# Patient Record
Sex: Female | Born: 1955 | Race: White | Hispanic: No | Smoking: Never smoker
Health system: Southern US, Community
[De-identification: ages and names within clinical notes are randomized; demographics above are authoritative.]

## PROBLEM LIST (undated history)

## (undated) DIAGNOSIS — E079 Disorder of thyroid, unspecified: Secondary | ICD-10-CM

## (undated) DIAGNOSIS — Z9189 Other specified personal risk factors, not elsewhere classified: Secondary | ICD-10-CM

## (undated) DIAGNOSIS — E559 Vitamin D deficiency, unspecified: Secondary | ICD-10-CM

## (undated) DIAGNOSIS — Z1371 Encounter for nonprocreative screening for genetic disease carrier status: Secondary | ICD-10-CM

## (undated) DIAGNOSIS — K219 Gastro-esophageal reflux disease without esophagitis: Secondary | ICD-10-CM

## (undated) DIAGNOSIS — L509 Urticaria, unspecified: Secondary | ICD-10-CM

## (undated) DIAGNOSIS — E785 Hyperlipidemia, unspecified: Secondary | ICD-10-CM

## (undated) DIAGNOSIS — E039 Hypothyroidism, unspecified: Secondary | ICD-10-CM

## (undated) DIAGNOSIS — Z803 Family history of malignant neoplasm of breast: Secondary | ICD-10-CM

## (undated) DIAGNOSIS — F32A Depression, unspecified: Secondary | ICD-10-CM

## (undated) DIAGNOSIS — K589 Irritable bowel syndrome without diarrhea: Secondary | ICD-10-CM

## (undated) DIAGNOSIS — F411 Generalized anxiety disorder: Secondary | ICD-10-CM

## (undated) DIAGNOSIS — Z8041 Family history of malignant neoplasm of ovary: Secondary | ICD-10-CM

## (undated) DIAGNOSIS — F329 Major depressive disorder, single episode, unspecified: Secondary | ICD-10-CM

## (undated) DIAGNOSIS — F419 Anxiety disorder, unspecified: Secondary | ICD-10-CM

## (undated) HISTORY — PX: BREAST BIOPSY: SHX20

## (undated) HISTORY — DX: Anxiety disorder, unspecified: F41.9

## (undated) HISTORY — DX: Hyperlipidemia, unspecified: E78.5

## (undated) HISTORY — DX: Disorder of thyroid, unspecified: E07.9

## (undated) HISTORY — DX: Family history of malignant neoplasm of ovary: Z80.41

## (undated) HISTORY — PX: TONSILLECTOMY: SUR1361

## (undated) HISTORY — DX: Gastro-esophageal reflux disease without esophagitis: K21.9

## (undated) HISTORY — DX: Family history of malignant neoplasm of breast: Z80.3

## (undated) HISTORY — DX: Vitamin D deficiency, unspecified: E55.9

## (undated) HISTORY — DX: Major depressive disorder, single episode, unspecified: F32.9

## (undated) HISTORY — DX: Depression, unspecified: F32.A

## (undated) HISTORY — PX: BREAST SURGERY: SHX581

---
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
## 2004-03-21 ENCOUNTER — Ambulatory Visit: Payer: Self-pay

## 2005-07-09 ENCOUNTER — Ambulatory Visit: Payer: Self-pay

## 2006-08-06 ENCOUNTER — Ambulatory Visit: Payer: Self-pay

## 2007-01-10 ENCOUNTER — Ambulatory Visit: Payer: Self-pay | Admitting: Unknown Physician Specialty

## 2007-11-14 ENCOUNTER — Ambulatory Visit: Payer: Self-pay

## 2009-02-10 ENCOUNTER — Ambulatory Visit: Payer: Self-pay

## 2009-02-15 ENCOUNTER — Ambulatory Visit: Payer: Self-pay

## 2009-08-23 ENCOUNTER — Ambulatory Visit: Payer: Self-pay | Admitting: General Surgery

## 2010-04-17 ENCOUNTER — Ambulatory Visit: Payer: Self-pay

## 2011-06-13 ENCOUNTER — Ambulatory Visit: Payer: Self-pay

## 2011-06-15 ENCOUNTER — Ambulatory Visit: Payer: Self-pay

## 2012-06-16 ENCOUNTER — Ambulatory Visit: Payer: Self-pay

## 2013-01-02 ENCOUNTER — Ambulatory Visit: Payer: Self-pay | Admitting: Emergency Medicine

## 2013-06-24 ENCOUNTER — Ambulatory Visit: Payer: Self-pay

## 2014-05-07 HISTORY — PX: ABDOMINAL HYSTERECTOMY: SHX81

## 2014-06-09 ENCOUNTER — Ambulatory Visit: Payer: Self-pay | Admitting: Obstetrics and Gynecology

## 2014-06-09 LAB — BASIC METABOLIC PANEL
Anion Gap: 6 — ABNORMAL LOW (ref 7–16)
BUN: 11 mg/dL (ref 7–18)
Calcium, Total: 8.8 mg/dL (ref 8.5–10.1)
Chloride: 107 mmol/L (ref 98–107)
Co2: 28 mmol/L (ref 21–32)
Creatinine: 0.96 mg/dL (ref 0.60–1.30)
EGFR (Non-African Amer.): >60
Glucose: 68 mg/dL (ref 65–99)
Osmolality: 279 (ref 275–301)
Potassium: 4.5 mmol/L (ref 3.5–5.1)
Sodium: 141 mmol/L (ref 136–145)

## 2014-06-09 LAB — CBC
HCT: 36.9 % (ref 35.0–47.0)
HGB: 12.2 g/dL (ref 12.0–16.0)
MCH: 27.3 pg (ref 26.0–34.0)
MCHC: 33.1 g/dL (ref 32.0–36.0)
MCV: 82 fL (ref 80–100)
PLATELETS: 185 10*3/uL (ref 150–440)
RBC: 4.48 10*6/uL (ref 3.80–5.20)
RDW: 14.5 % (ref 11.5–14.5)
WBC: 4.2 10*3/uL (ref 3.6–11.0)

## 2014-06-17 ENCOUNTER — Ambulatory Visit: Payer: Self-pay | Admitting: Obstetrics and Gynecology

## 2014-07-14 ENCOUNTER — Ambulatory Visit: Payer: Self-pay

## 2014-08-30 LAB — SURGICAL PATHOLOGY

## 2014-09-05 NOTE — Op Note (Signed)
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   PROCEDURES: Total laparoscopic hysterectomy, bilateral salpingo-oophorectomy, and cystoscopy.   SURGEON: [place] Bingharlie Kiwan Gadsden, MD  ASSISTANT: Felizardo HoffmannStephan D. Jackson, MD   ANESTHESIA: General and local.   INTRAVENOUS FLUIDS: 1600 mL crystalloid.   ESTIMATED BLOOD LOSS: 50 mL.   URINE OUTPUT: 300 mL.   COMPLICATIONS: None.   ANTIBIOTICS: Ancef 2 grams given preoperatively.   SPECIMENS: Cervix, uterus, and bilateral tubes and ovaries to pathology.   DISPOSITION: Stable to PACU.   FINDINGS: Normal pelvic anatomy. No intra-abdominal adhesions were noted. Normal-appearing cervix, uterus, tubes, and ovaries. Normal liver and stomach edge. On cystoscopy, normal bladder with positive jets from her ureteral orifices.  DESCRIPTION OF PROCEDURE: The patient was taken to the operating room, where the anesthesia was administered. She was then prepped and draped in normal sterile fashion in dorsal lithotomy position in [place] stirrups. Next, a Foley catheter was then placed and a speculum was inserted and a medium cup VCare uterine manipulator was then placed without difficulty. Next, gloves were then changed and attention was then turned to the patient's abdomen. The umbilical fold was injected with local anesthesia and a 10 mm skin incision was then made and the abdomen entered via the open technique. Next, the balloon trocar was then placed and laparoscope introduced with the above-noted findings. The abdomen was then insufflated and a 5 mm right lower quadrant port and a 10 mm left lower quadrant port were then placed under direct visualization. Next, the bilateral round ligaments were then cauterized and  transected, and a bladder flap was then created with the Ligasure and blunt dissection. Then, the posterior leaf of the broad ligament was then opened and the IP skeletonized. Next, the bilateral ureters were then traced from the pelvic brim down to the lower pelvis, and the IP ligaments were then serially cauterized and then transected with the LigaSure device. Next, the bilateral uterine arteries were then skeletonized and cauterized, and transected with the LigaSure device. Using the monopolar scissors, the colpotomy was then made, first on coag then on cut circumferentially around the top of the uterine manipulator, and the manipulator and specimens were then removed from the patient's vagina. A balloon was then placed inside the patient's vagina, and the vagina was then closed using the 2-0 V-Loc on the Endo Stitch device. At this time, the patient's abdomen  pressure was then lowered to 6, and all pedicles were observed and noted to be hemostatic. Next, attention was then turned to the patient's bladder and the Foley catheter was removed, and a cystoscope introduced with the above-noted findings. Next, the remaining ports were then removed under direct visualization with the umbilical and left lower quadrant fascia closed using 0 Vicryl and the skin incisions closed with 4-0 Monocryl at the umbilicus and Dermabond, and Dermabond at the right and left lower quadrant port sites. Sponge, lap, needle, and instrument counts were correct x2, and the patient was taken to recovery room, awake, alert, breathing independently in stable condition.    ____________________________ [place] Bingharlie Alivia Cimino, MD cp:mw D: 06/17/2014 14:53:39 ET T: 06/17/2014 19:35:05 ET JOB#: [id number]  cc: [place] Bingharlie Justo Hengel, MD, <Dictator> [place] BingHARLIE Serrena Linderman MD ELECTRONICALLY SIGNED 06/27/2014 12:41

## 2015-06-27 ENCOUNTER — Other Ambulatory Visit: Payer: Self-pay | Admitting: Unknown Physician Specialty

## 2015-06-27 DIAGNOSIS — Z1231 Encounter for screening mammogram for malignant neoplasm of breast: Secondary | ICD-10-CM

## 2015-07-15 ENCOUNTER — Ambulatory Visit
Admission: RE | Admit: 2015-07-15 | Discharge: 2015-07-15 | Disposition: A | Discharge status: Home / Self Care | Payer: BC Managed Care – PPO | Source: Ambulatory Visit | Attending: Unknown Physician Specialty | Admitting: Unknown Physician Specialty

## 2015-07-15 DIAGNOSIS — Z1231 Encounter for screening mammogram for malignant neoplasm of breast: Secondary | ICD-10-CM | POA: Insufficient documentation

## 2016-04-11 IMAGING — CR DG CHEST 2V
1 series · 2 of 2 positions shown · non-contrast
Comparison: None.

CLINICAL DATA: Cough for couple of weeks, hypothyroidism

EXAM:
CHEST  2 VIEW

[Series 1: dxr chest pa (or ap) and lateral · 0.14mm/px · 2 of 2 slices shown]
[im 1/2]
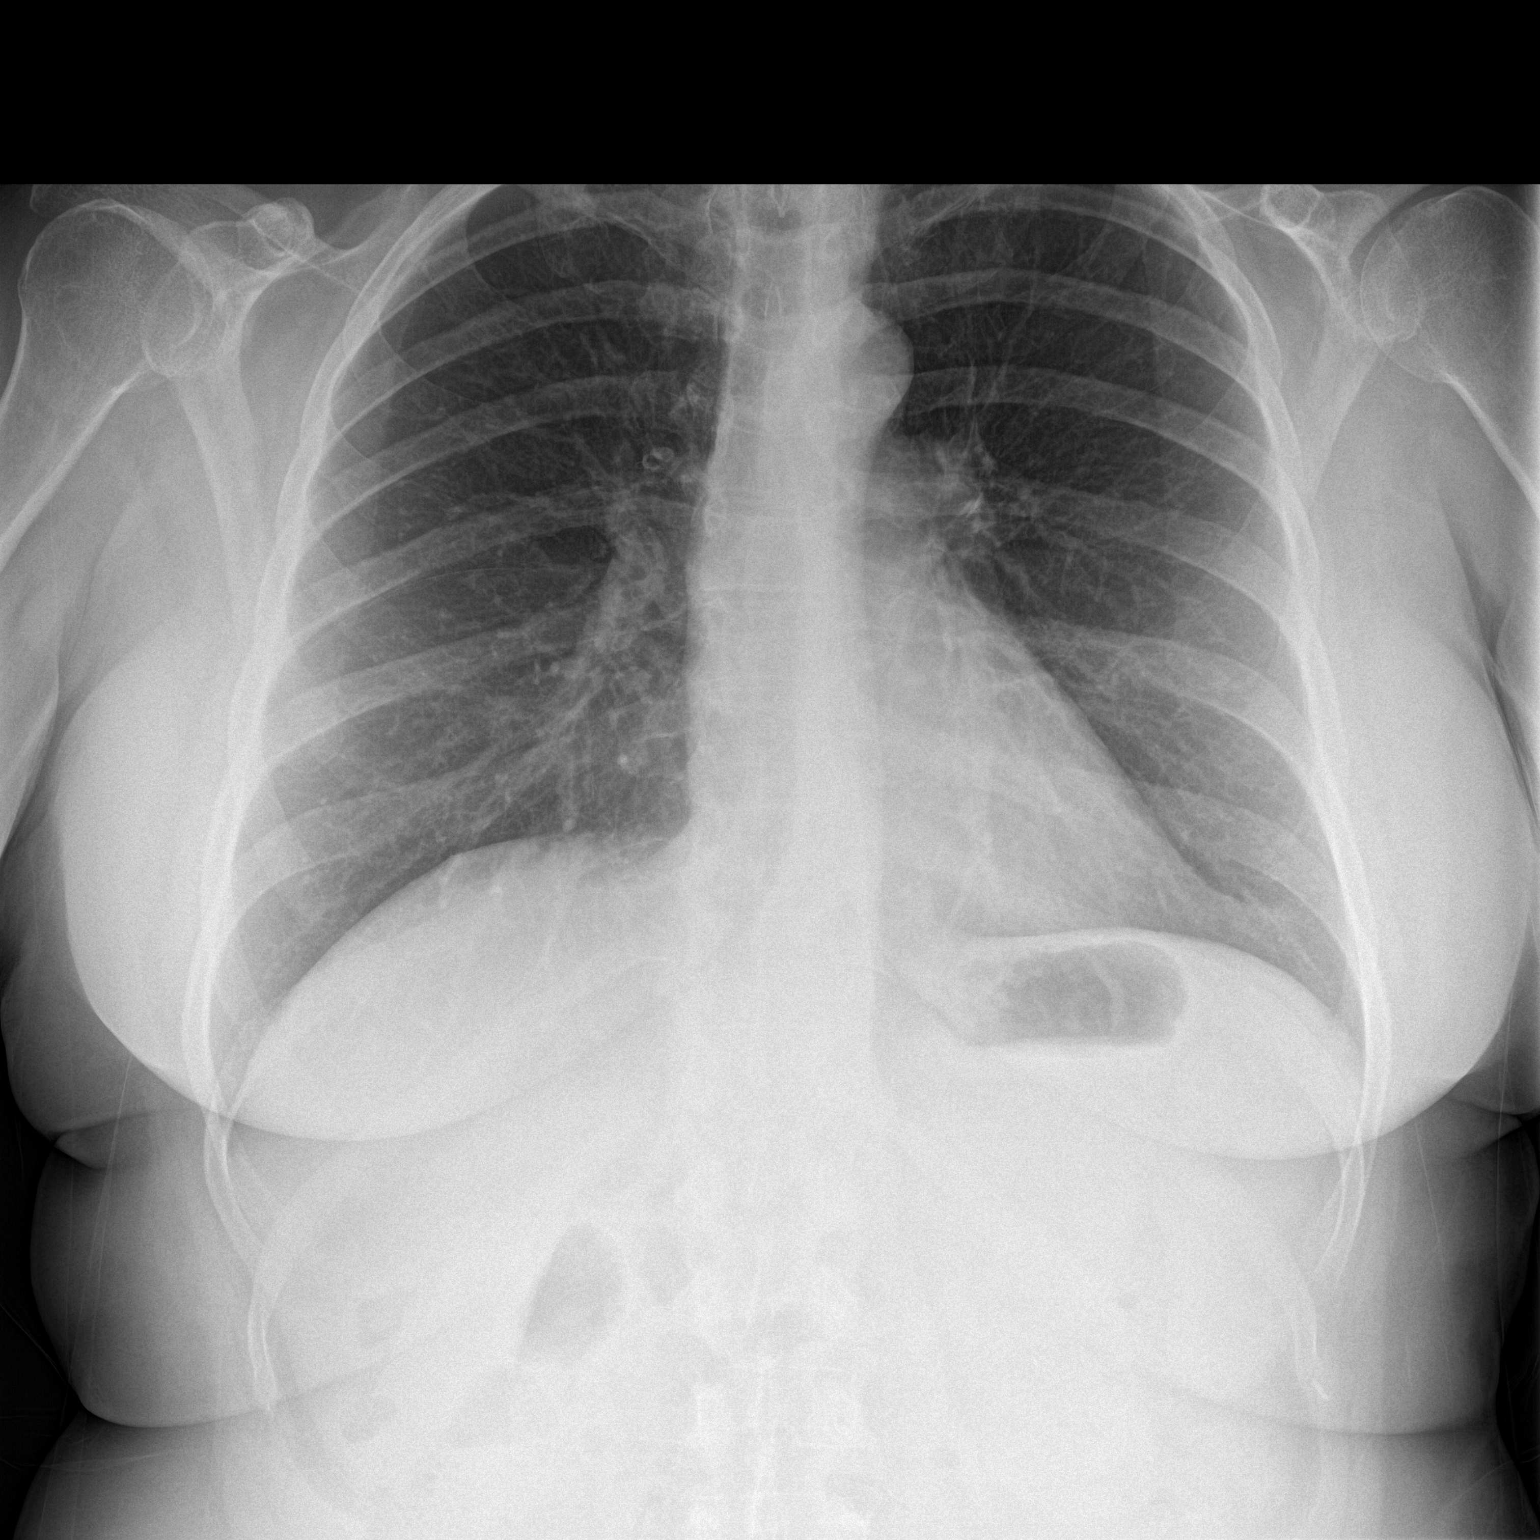
[im 2/2]
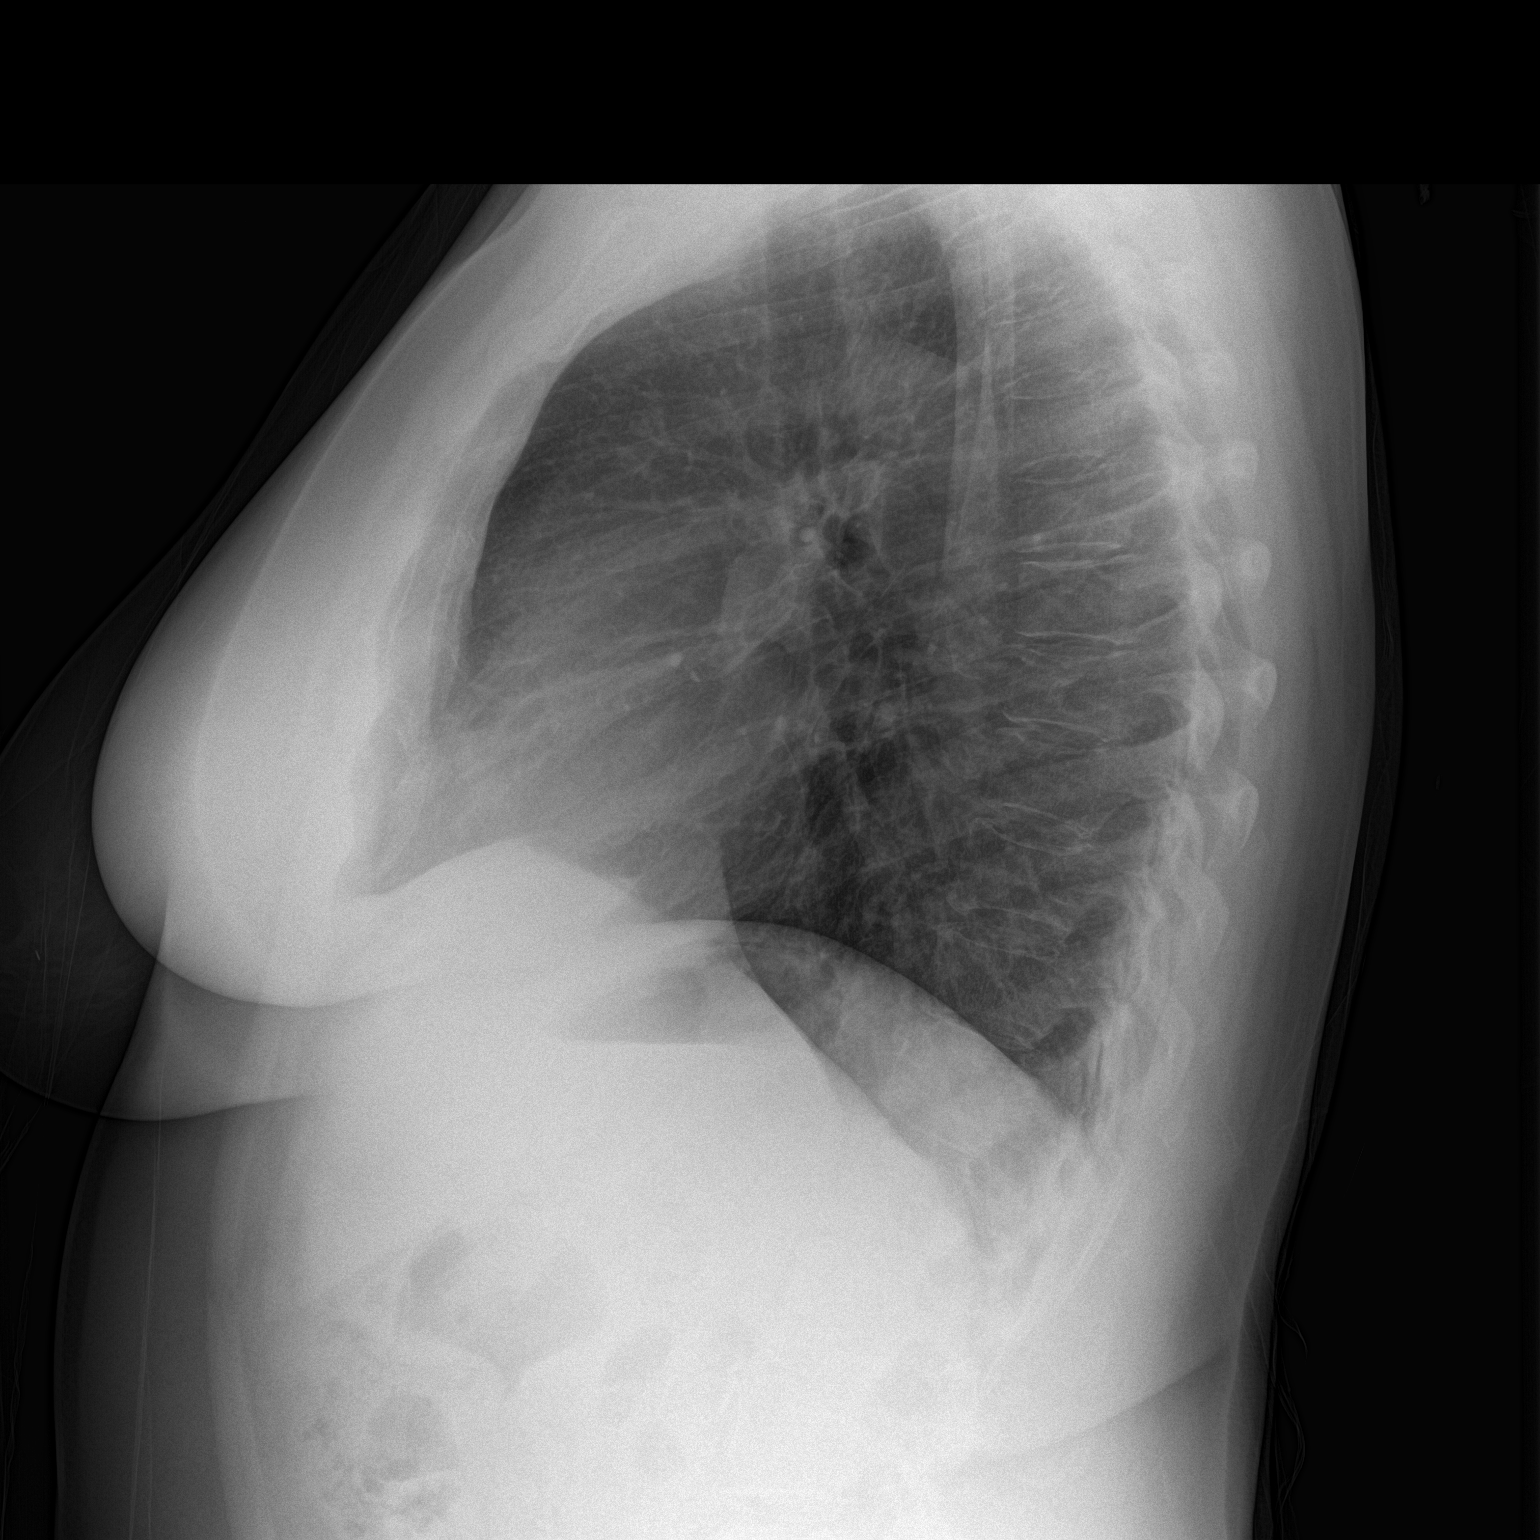

[2 of 2 positions shown; findings below may reference images not displayed]

FINDINGS: Cardiomediastinal silhouette is unremarkable. No acute infiltrate or
pleural effusion. No pulmonary edema. Bony thorax is unremarkable.
IMPRESSION: No active cardiopulmonary disease.

## 2016-05-18 ENCOUNTER — Other Ambulatory Visit: Payer: Self-pay | Admitting: Obstetrics and Gynecology

## 2016-05-18 DIAGNOSIS — Z1231 Encounter for screening mammogram for malignant neoplasm of breast: Secondary | ICD-10-CM

## 2016-08-08 ENCOUNTER — Ambulatory Visit
Admission: RE | Admit: 2016-08-08 | Discharge: 2016-08-08 | Disposition: A | Discharge status: Home / Self Care | Payer: BC Managed Care – PPO | Source: Ambulatory Visit | Attending: Obstetrics and Gynecology | Admitting: Obstetrics and Gynecology

## 2016-08-08 DIAGNOSIS — Z1231 Encounter for screening mammogram for malignant neoplasm of breast: Secondary | ICD-10-CM | POA: Insufficient documentation

## 2016-08-12 ENCOUNTER — Encounter: Payer: Self-pay | Admitting: Obstetrics and Gynecology

## 2016-08-20 ENCOUNTER — Ambulatory Visit (INDEPENDENT_AMBULATORY_CARE_PROVIDER_SITE_OTHER): Payer: BC Managed Care – PPO | Admitting: Obstetrics and Gynecology

## 2016-08-20 ENCOUNTER — Encounter: Payer: Self-pay | Admitting: Obstetrics and Gynecology

## 2016-08-20 VITALS — BP 120/70 | HR 67 | Ht 67.0 in | Wt 186.0 lb

## 2016-08-20 DIAGNOSIS — Z124 Encounter for screening for malignant neoplasm of cervix: Secondary | ICD-10-CM

## 2016-08-20 DIAGNOSIS — Z803 Family history of malignant neoplasm of breast: Secondary | ICD-10-CM

## 2016-08-20 DIAGNOSIS — Z1151 Encounter for screening for human papillomavirus (HPV): Secondary | ICD-10-CM

## 2016-08-20 DIAGNOSIS — Z01419 Encounter for gynecological examination (general) (routine) without abnormal findings: Secondary | ICD-10-CM | POA: Diagnosis not present

## 2016-08-20 DIAGNOSIS — D069 Carcinoma in situ of cervix, unspecified: Secondary | ICD-10-CM | POA: Insufficient documentation

## 2016-08-20 DIAGNOSIS — Z8041 Family history of malignant neoplasm of ovary: Secondary | ICD-10-CM | POA: Diagnosis not present

## 2016-08-20 NOTE — Progress Notes (Signed)
Chief Complaint  Patient presents with  . Gynecologic Exam    HPI:      [name] is a 61 y.o. No obstetric history on file. who LMP was No LMP recorded. Patient has had a hysterectomy., presents today for her annual examination.  Her menses are absent due to menopause/hyst.  Dysmenorrhea none. She does not have intermenstrual bleeding.  She does not have vasomotor sx.  Sex activity: not sexually active. She does not have vaginal dryness.  Last Pap: August 05, 2015  Results were: no abnormalities /neg HPV DNA.    Last mammogram: August 08, 2016  Results were: normal--routine follow-up in 12 months There is a FH of breast cancer in her sister, who is BRCA neg. There is a FH of ovarian cancer in her mom. Pt is s/p BSO. The patient does do self-breast exams.  Colonoscopy: scheduled for this yr with Dr. Vira Agar  Tobacco use: The patient denies current or previous tobacco use. Alcohol use: none Exercise: moderately active  She does get adequate calcium and Vitamin D in her diet.   Past Medical History:  Diagnosis Date  . Depression   . Family history of breast cancer    sister is BRCA neg  . Family history of ovarian cancer    mother, pt is s/p BSO  . GERD (gastroesophageal reflux disease)   . Hyperlipidemia   . Thyroid disease   . Vitamin D deficiency     Past Surgical History:  Procedure Laterality Date  . ABDOMINAL HYSTERECTOMY  2016   TAH BSO Dr. Ilda Basset  . BREAST SURGERY Right    breast biospy needle, negative Dr. Bary Castilla  . TONSILLECTOMY      Family History  Problem Relation Age of Onset  . Breast cancer Sister 80  . Uterine cancer Sister 82  . Ovarian cancer Mother 57    Social History   Social History  . Marital status: Divorced    Spouse name: N/A  . Number of children: N/A  . Years of education: N/A   Occupational History  . Not on file.   Social History Main Topics  . Smoking status: Never Smoker  . Smokeless tobacco: Never Used  .  Alcohol use No  . Drug use: No  . Sexual activity: Not Currently   Other Topics Concern  . Not on file   Social History Narrative  . No narrative on file     Current Outpatient Prescriptions:  .  FLUoxetine (PROZAC) 20 MG capsule, Take by mouth., Disp: , Rfl:  .  pravastatin (PRAVACHOL) 20 MG tablet, Take by mouth., Disp: , Rfl:  .  levothyroxine (SYNTHROID, LEVOTHROID) 88 MCG tablet, Take by mouth., Disp: , Rfl:  .  Omeprazole 20 MG TBEC, Take by mouth., Disp: , Rfl:  .  Vitamin D, Ergocalciferol, (DRISDOL) 50000 units CAPS capsule, , Disp: , Rfl:    ROS:  Review of Systems  Constitutional: Negative for fever, malaise/fatigue and weight loss.  HENT: Negative for congestion, ear pain and sinus pain.   Respiratory: Negative for cough, shortness of breath and wheezing.   Cardiovascular: Negative for chest pain, orthopnea and leg swelling.  Gastrointestinal: Negative for constipation, diarrhea, nausea and vomiting.  Genitourinary: Negative for dysuria, frequency, hematuria and urgency.       Breast ROS: negative   Musculoskeletal: Negative for back pain, joint pain and myalgias.  Skin: Negative for itching and rash.  Neurological: Negative for dizziness, tingling, focal weakness and headaches.  Endo/Heme/Allergies: Negative for environmental allergies. Does not bruise/bleed easily.  Psychiatric/Behavioral: Negative for depression and suicidal ideas. The patient is not nervous/anxious and does not have insomnia.     Objective: BP 120/70   Pulse 67   Ht _0  (1.702 m)   Wt 186 lb (84.4 kg)   BMI 29.13 kg/m    Physical Exam  Constitutional: She is oriented to person, place, and time. She appears well-developed and well-nourished.  Genitourinary: Vagina normal. No erythema or tenderness in the vagina. No vaginal discharge found. Right adnexum does not display mass and does not display tenderness. Left adnexum does not display mass and does not display tenderness.    Genitourinary Comments: CX AND UTERUS ABSENT  Neck: Normal range of motion. No thyromegaly present.  Cardiovascular: Normal rate, regular rhythm and normal heart sounds.   No murmur heard. Pulmonary/Chest: Effort normal and breath sounds normal. Right breast exhibits no mass, no nipple discharge, no skin change and no tenderness. Left breast exhibits no mass, no nipple discharge, no skin change and no tenderness.  Abdominal: Soft. There is no tenderness. There is no guarding.  Musculoskeletal: Normal range of motion.  Neurological: She is alert and oriented to person, place, and time. No cranial nerve deficit.  Psychiatric: She has a normal mood and affect. Her behavior is normal.  Vitals reviewed.   Assessment/Plan: Encounter for annual routine gynecological examination  Cervical cancer screening - Hx of abn paps prior to 2016 hyst. - Plan: IGP, Aptima HPV  Screening for HPV (human papillomavirus) - Plan: IGP, Aptima HPV  Family history of breast cancer - My Risk testing discussed, handout given. Pt to discuss update testing with sister and will f/u if desires personal testing. Cont with mammos yearly/monthly SBE  Family history of ovarian cancer  Severe cervical dysplasia - CIN 3 2016, s/p LEEP and TAH           GYN counsel adequate intake of calcium and vitamin D     F/U  Return in about 1 year (around 08/20/2017).  Alicia B. Copland, PA-C 08/20/2016 2:30 PM

## 2016-08-22 LAB — IGP, APTIMA HPV
HPV Aptima: NEGATIVE
PAP SMEAR COMMENT: 0

## 2016-09-04 HISTORY — PX: COLONOSCOPY: SHX174

## 2016-10-03 ENCOUNTER — Encounter: Payer: Self-pay | Admitting: Obstetrics and Gynecology

## 2016-12-24 ENCOUNTER — Telehealth: Payer: Self-pay

## 2016-12-24 NOTE — Telephone Encounter (Signed)
Pt states she needs a TDAP inj.  How to go about lining that up.  [phone]

## 2016-12-24 NOTE — Telephone Encounter (Signed)
Pls have pt sched with nurse for TdAP. Thx.

## 2016-12-25 NOTE — Telephone Encounter (Signed)
Per pt refused to schedule to to cost on copay for schedule appt

## 2016-12-26 ENCOUNTER — Ambulatory Visit: Payer: BC Managed Care – PPO

## 2017-08-14 ENCOUNTER — Other Ambulatory Visit: Payer: Self-pay | Admitting: Obstetrics and Gynecology

## 2017-08-14 DIAGNOSIS — Z1231 Encounter for screening mammogram for malignant neoplasm of breast: Secondary | ICD-10-CM

## 2017-09-02 ENCOUNTER — Encounter: Payer: Self-pay | Admitting: Obstetrics and Gynecology

## 2017-09-02 ENCOUNTER — Ambulatory Visit
Admission: RE | Admit: 2017-09-02 | Discharge: 2017-09-02 | Disposition: A | Discharge status: Home / Self Care | Payer: BC Managed Care – PPO | Source: Ambulatory Visit | Attending: Obstetrics and Gynecology | Admitting: Obstetrics and Gynecology

## 2017-09-02 DIAGNOSIS — Z1231 Encounter for screening mammogram for malignant neoplasm of breast: Secondary | ICD-10-CM | POA: Insufficient documentation

## 2017-09-11 ENCOUNTER — Ambulatory Visit: Payer: BC Managed Care – PPO | Admitting: Obstetrics and Gynecology

## 2017-09-24 ENCOUNTER — Encounter: Payer: Self-pay | Admitting: Obstetrics and Gynecology

## 2017-09-24 ENCOUNTER — Ambulatory Visit (INDEPENDENT_AMBULATORY_CARE_PROVIDER_SITE_OTHER): Payer: BC Managed Care – PPO | Admitting: Obstetrics and Gynecology

## 2017-09-24 VITALS — BP 130/80 | HR 70 | Ht 67.0 in | Wt 187.0 lb

## 2017-09-24 DIAGNOSIS — Z1231 Encounter for screening mammogram for malignant neoplasm of breast: Secondary | ICD-10-CM

## 2017-09-24 DIAGNOSIS — Z124 Encounter for screening for malignant neoplasm of cervix: Secondary | ICD-10-CM

## 2017-09-24 DIAGNOSIS — Z1239 Encounter for other screening for malignant neoplasm of breast: Secondary | ICD-10-CM

## 2017-09-24 DIAGNOSIS — Z1151 Encounter for screening for human papillomavirus (HPV): Secondary | ICD-10-CM | POA: Diagnosis not present

## 2017-09-24 DIAGNOSIS — Z803 Family history of malignant neoplasm of breast: Secondary | ICD-10-CM | POA: Diagnosis not present

## 2017-09-24 DIAGNOSIS — Z01419 Encounter for gynecological examination (general) (routine) without abnormal findings: Secondary | ICD-10-CM

## 2017-09-24 DIAGNOSIS — D069 Carcinoma in situ of cervix, unspecified: Secondary | ICD-10-CM | POA: Diagnosis not present

## 2017-09-24 NOTE — Patient Instructions (Signed)
I value your feedback and entrusting us with your care. If you get a [facility] patient survey, I would appreciate you taking the time to let us know about your experience today. Thank you! 

## 2017-09-24 NOTE — Progress Notes (Addendum)
Chief Complaint  Patient presents with  . Gynecologic Exam     HPI:      [name] is a 62 y.o. G1P1001 who LMP was No LMP recorded. Patient has had a hysterectomy., presents today for her annual examination. Her menses are absent due to menopause/hyst.  Dysmenorrhea none. She does not have intermenstrual bleeding.  She does not have vasomotor sx.  Sex activity: is sexually active. She does not have vaginal dryness.  Last Pap: 08/20/16  Results were: no abnormalities /neg HPV DNA. S/p CIN 3 2016 with LEEP/TAH  Last mammogram: 09/02/17  Results were: normal--routine follow-up in 12 months There is a FH of breast cancer in her sister, who is BRCA neg. There is a FH of ovarian cancer in her mom. Pt is s/p BSO. The patient does do self-breast exams. Pt declined genetic testing last yr.  Colonoscopy: 2018, repeat due after 5 yrs Tobacco use: The patient denies current or previous tobacco use. Alcohol use: none Exercise: moderately active  She does get adequate calcium and Vitamin D in her diet. Labs with PCP.    Past Medical History:  Diagnosis Date  . Anxiety   . Depression   . Family history of breast cancer    sister is BRCA neg  . Family history of ovarian cancer    mother, pt is s/p BSO  . GERD (gastroesophageal reflux disease)   . Hyperlipidemia   . Thyroid disease   . Vitamin D deficiency     Past Surgical History:  Procedure Laterality Date  . ABDOMINAL HYSTERECTOMY  2016   TAH BSO Dr. Ilda Basset  . BREAST SURGERY Right    breast biospy needle, negative Dr. Bary Castilla  . COLONOSCOPY  09/2016   2 polyps; repeat in 5 yrs; Dr. Rayann Heman  . TONSILLECTOMY      Family History  Problem Relation Age of Onset  . Breast cancer Sister 79  . Uterine cancer Sister 52  . Ovarian cancer Mother 56    Social History   Socioeconomic History  . Marital status: Divorced    Spouse name: Not on file  . Number of children: Not on file  . Years of education: Not on  file  . Highest education level: Not on file  Occupational History  . Not on file  Social Needs  . Financial resource strain: Not on file  . Food insecurity:    Worry: Not on file    Inability: Not on file  . Transportation needs:    Medical: Not on file    Non-medical: Not on file  Tobacco Use  . Smoking status: Never Smoker  . Smokeless tobacco: Never Used  Substance and Sexual Activity  . Alcohol use: No  . Drug use: No  . Sexual activity: Not Currently  Lifestyle  . Physical activity:    Days per week: Not on file    Minutes per session: Not on file  . Stress: Not on file  Relationships  . Social connections:    Talks on phone: Not on file    Gets together: Not on file    Attends religious service: Not on file    Active member of club or organization: Not on file    Attends meetings of clubs or organizations: Not on file    Relationship status: Not on file  . Intimate partner violence:    Fear of current or ex partner: Not on file    Emotionally abused: Not  on file    Physically abused: Not on file    Forced sexual activity: Not on file  Other Topics Concern  . Not on file  Social History Narrative  . Not on file    Current Outpatient Medications on File Prior to Visit  Medication Sig Dispense Refill  . levothyroxine (SYNTHROID, LEVOTHROID) 88 MCG tablet Take by mouth.    . Omeprazole 20 MG TBEC Take by mouth.    . pravastatin (PRAVACHOL) 20 MG tablet Take by mouth.    . Vitamin D, Ergocalciferol, (DRISDOL) 50000 units CAPS capsule     . escitalopram (LEXAPRO) 20 MG tablet   1  . FLUoxetine (PROZAC) 20 MG capsule Take by mouth.    . liothyronine (CYTOMEL) 5 MCG tablet   1   No current facility-administered medications on file prior to visit.     ROS:  Review of Systems  Constitutional: Negative for fatigue, fever and unexpected weight change.  Respiratory: Negative for cough, shortness of breath and wheezing.   Cardiovascular: Negative for chest pain,  palpitations and leg swelling.  Gastrointestinal: Negative for blood in stool, constipation, diarrhea, nausea and vomiting.  Endocrine: Negative for cold intolerance, heat intolerance and polyuria.  Genitourinary: Negative for dyspareunia, dysuria, flank pain, frequency, genital sores, hematuria, menstrual problem, pelvic pain, urgency, vaginal bleeding, vaginal discharge and vaginal pain.  Musculoskeletal: Negative for back pain, joint swelling and myalgias.  Skin: Negative for rash.  Neurological: Negative for dizziness, syncope, light-headedness, numbness and headaches.  Hematological: Negative for adenopathy.  Psychiatric/Behavioral: Negative for agitation, confusion, sleep disturbance and suicidal ideas. The patient is not nervous/anxious.     Objective: BP 130/80   Pulse 70   Ht '5\' 7"'$  (1.702 m)   Wt 187 lb (84.8 kg)   BMI 29.29 kg/m    Physical Exam  Constitutional: She is oriented to person, place, and time. She appears well-developed and well-nourished.  Genitourinary: Vagina normal. There is no rash or tenderness on the right labia. There is no rash or tenderness on the left labia. No erythema or tenderness in the vagina. No vaginal discharge found. Right adnexum does not display mass and does not display tenderness. Left adnexum does not display mass and does not display tenderness.  Genitourinary Comments: UTERUS/CX SURG REM  Neck: Normal range of motion. No thyromegaly present.  Cardiovascular: Normal rate, regular rhythm and normal heart sounds.  No murmur heard. Pulmonary/Chest: Effort normal and breath sounds normal. Right breast exhibits no mass, no nipple discharge, no skin change and no tenderness. Left breast exhibits no mass, no nipple discharge, no skin change and no tenderness.  Abdominal: Soft. There is no tenderness. There is no guarding.  Musculoskeletal: Normal range of motion.  Neurological: She is alert and oriented to person, place, and time. No cranial  nerve deficit.  Psychiatric: She has a normal mood and affect. Her behavior is normal.  Vitals reviewed.   Assessment/Plan: Encounter for annual routine gynecological examination  Screening for breast cancer - Pt current on mammo.  Family history of breast cancer - MyRIsk testing discussed but pt declines. Also discussed update testing for pt's sister.   Cervical cancer screening - Plan: IGP, Aptima HPV  Screening for HPV (human papillomavirus) - Plan: IGP, Aptima HPV  CIN III with severe dysplasia - 2016. Repeat pap today. Do yearly for now.       GYN counsel breast self exam, mammography screening, menopause, adequate intake of calcium and vitamin D, diet and exercise  F/U  Return in about 1 year (around 09/25/2018).  Tashiya Souders B. Soua Caltagirone, PA-C 09/24/2017 2:24 PM

## 2017-10-02 LAB — IGP, APTIMA HPV
HPV Aptima: NEGATIVE
PAP Smear Comment: 0

## 2018-05-19 ENCOUNTER — Other Ambulatory Visit: Payer: Self-pay | Admitting: Obstetrics and Gynecology

## 2018-05-19 DIAGNOSIS — Z1231 Encounter for screening mammogram for malignant neoplasm of breast: Secondary | ICD-10-CM

## 2018-10-05 NOTE — Progress Notes (Signed)
Chief Complaint  Patient presents with  . Gynecologic Exam     HPI:      [name] is a 63 y.o. G1P1001 who LMP was No LMP recorded. Patient has had a hysterectomy., presents today for her annual examination. Her menses are absent due to menopause/hyst.  Dysmenorrhea none. She does not have intermenstrual bleeding.  She does not have vasomotor sx.  Sex activity: is sexually active. She does have mild vaginal dryness improved with lubricants.  Last Pap: 09/24/17  Results were: no abnormalities /neg HPV DNA. S/p CIN 3 2016 with LEEP/TAH  Last mammogram: 09/02/17  Results were: normal--routine follow-up in 12 months. Has appt 7/20. There is a FH of breast cancer in her sister, who is BRCA neg. There is a FH of ovarian cancer in her mom. Pt is s/p BSO. The patient does do self-breast exams. Pt declined genetic testing past few yrs.  Colonoscopy: 2018, repeat due after 5 yrs Tobacco use: The patient denies current or previous tobacco use. Alcohol use: none Exercise: moderately active  She does get adequate calcium and Vitamin D in her diet. Labs with PCP.    Past Medical History:  Diagnosis Date  . Anxiety   . Depression   . Family history of breast cancer    sister is BRCA neg  . Family history of ovarian cancer    mother, pt is s/p BSO  . GERD (gastroesophageal reflux disease)   . Hyperlipidemia   . Thyroid disease   . Vitamin D deficiency     Past Surgical History:  Procedure Laterality Date  . ABDOMINAL HYSTERECTOMY  2016   TAH BSO Dr. Ilda Basset  . BREAST SURGERY Right    breast biospy needle, negative Dr. Bary Castilla  . COLONOSCOPY  09/2016   2 polyps; repeat in 5 yrs; Dr. Rayann Heman  . TONSILLECTOMY      Family History  Problem Relation Age of Onset  . Breast cancer Sister 25  . Uterine cancer Sister 66  . Ovarian cancer Mother 16    Social History   Socioeconomic History  . Marital status: Divorced    Spouse name: Not on file  . Number of  children: Not on file  . Years of education: Not on file  . Highest education level: Not on file  Occupational History  . Not on file  Social Needs  . Financial resource strain: Not on file  . Food insecurity:    Worry: Not on file    Inability: Not on file  . Transportation needs:    Medical: Not on file    Non-medical: Not on file  Tobacco Use  . Smoking status: Never Smoker  . Smokeless tobacco: Never Used  Substance and Sexual Activity  . Alcohol use: No  . Drug use: No  . Sexual activity: Yes    Birth control/protection: Surgical    Comment: Hysterectomy  Lifestyle  . Physical activity:    Days per week: Not on file    Minutes per session: Not on file  . Stress: Not on file  Relationships  . Social connections:    Talks on phone: Not on file    Gets together: Not on file    Attends religious service: Not on file    Active member of club or organization: Not on file    Attends meetings of clubs or organizations: Not on file    Relationship status: Not on file  . Intimate partner violence:  Fear of current or ex partner: Not on file    Emotionally abused: Not on file    Physically abused: Not on file    Forced sexual activity: Not on file  Other Topics Concern  . Not on file  Social History Narrative  . Not on file    Current Outpatient Medications on File Prior to Visit  Medication Sig Dispense Refill  . augmented betamethasone dipropionate (DIPROLENE-AF) 0.05 % cream APPLY CREAM TOPICALLY TWICE DAILY AS NEEDED TO SPOT TREAT FOR ITCHING    . cetirizine (ZYRTEC) 10 MG tablet Take by mouth.    . escitalopram (LEXAPRO) 20 MG tablet   1  . levothyroxine (SYNTHROID, LEVOTHROID) 88 MCG tablet Take by mouth.    . liothyronine (CYTOMEL) 5 MCG tablet   1  . omeprazole (PRILOSEC) 20 MG capsule     . pravastatin (PRAVACHOL) 40 MG tablet Take 40 mg by mouth at bedtime.    . valACYclovir (VALTREX) 500 MG tablet TAKE 1 CAPLET BY MOUTH ONCE DAILY    . Vitamin D,  Ergocalciferol, (DRISDOL) 50000 units CAPS capsule      No current facility-administered medications on file prior to visit.     ROS:  Review of Systems  Constitutional: Negative for fatigue, fever and unexpected weight change.  Respiratory: Negative for cough, shortness of breath and wheezing.   Cardiovascular: Negative for chest pain, palpitations and leg swelling.  Gastrointestinal: Negative for blood in stool, constipation, diarrhea, nausea and vomiting.  Endocrine: Negative for cold intolerance, heat intolerance and polyuria.  Genitourinary: Negative for dyspareunia, dysuria, flank pain, frequency, genital sores, hematuria, menstrual problem, pelvic pain, urgency, vaginal bleeding, vaginal discharge and vaginal pain.  Musculoskeletal: Negative for back pain, joint swelling and myalgias.  Skin: Negative for rash.  Neurological: Negative for dizziness, syncope, light-headedness, numbness and headaches.  Hematological: Negative for adenopathy.  Psychiatric/Behavioral: Positive for agitation. Negative for confusion, sleep disturbance and suicidal ideas. The patient is not nervous/anxious.     Objective: BP 136/90   Ht 5' 7.5" (1.715 m)   Wt 193 lb 9.6 oz (87.8 kg)   BMI 29.87 kg/m    Physical Exam Constitutional:      Appearance: She is well-developed.  Genitourinary:     Vulva and vagina normal.     No vaginal discharge, erythema or tenderness.     Cervix is absent.     Uterus is absent.     No right or left adnexal mass present.     Right adnexa absent.     Right adnexa not tender.     Left adnexa absent.     Left adnexa not tender.     Genitourinary Comments: UTERUS/CX SURG REM  Neck:     Musculoskeletal: Normal range of motion.     Thyroid: No thyromegaly.  Cardiovascular:     Rate and Rhythm: Normal rate and regular rhythm.     Heart sounds: Normal heart sounds. No murmur.  Pulmonary:     Effort: Pulmonary effort is normal.     Breath sounds: Normal breath  sounds.  Chest:     Breasts:        Right: No mass, nipple discharge, skin change or tenderness.        Left: No mass, nipple discharge, skin change or tenderness.  Abdominal:     Palpations: Abdomen is soft.     Tenderness: There is no abdominal tenderness. There is no guarding.  Musculoskeletal: Normal range of motion.  Neurological:  Mental Status: She is alert and oriented to person, place, and time.     Cranial Nerves: No cranial nerve deficit.  Psychiatric:        Behavior: Behavior normal.  Vitals signs reviewed.     Assessment/Plan: Encounter for annual routine gynecological examination  Cervical cancer screening - Plan: Cytology - PAP  Screening for HPV (human papillomavirus) - Plan: Cytology - PAP  CIN III with severe dysplasia - Plan: Cytology - PAP  Family history of breast cancer - MyRisk testing discussed and done today. Will call with results.  - Plan: [facility])  Family history of ovarian cancer - Plan: [facility] ([facility])       GYN counsel breast self exam, mammography screening, menopause, adequate intake of calcium and vitamin D, diet and exercise     F/U  Return in about 1 year (around 10/06/2019).  Remiel Corti B. Asharia Lotter, PA-C 10/06/2018 10:35 AM

## 2018-10-06 ENCOUNTER — Other Ambulatory Visit (HOSPITAL_COMMUNITY)
Admission: RE | Admit: 2018-10-06 | Discharge: 2018-10-06 | Disposition: A | Discharge status: Home / Self Care | Payer: BC Managed Care – PPO | Source: Ambulatory Visit | Attending: Obstetrics and Gynecology | Admitting: Obstetrics and Gynecology

## 2018-10-06 ENCOUNTER — Ambulatory Visit (INDEPENDENT_AMBULATORY_CARE_PROVIDER_SITE_OTHER): Payer: BC Managed Care – PPO | Admitting: Obstetrics and Gynecology

## 2018-10-06 ENCOUNTER — Other Ambulatory Visit: Payer: Self-pay

## 2018-10-06 ENCOUNTER — Encounter: Payer: Self-pay | Admitting: Obstetrics and Gynecology

## 2018-10-06 VITALS — BP 136/90 | Ht 67.5 in | Wt 193.6 lb

## 2018-10-06 DIAGNOSIS — Z8041 Family history of malignant neoplasm of ovary: Secondary | ICD-10-CM

## 2018-10-06 DIAGNOSIS — Z1371 Encounter for nonprocreative screening for genetic disease carrier status: Secondary | ICD-10-CM

## 2018-10-06 DIAGNOSIS — Z1151 Encounter for screening for human papillomavirus (HPV): Secondary | ICD-10-CM | POA: Insufficient documentation

## 2018-10-06 DIAGNOSIS — Z01419 Encounter for gynecological examination (general) (routine) without abnormal findings: Secondary | ICD-10-CM

## 2018-10-06 DIAGNOSIS — D069 Carcinoma in situ of cervix, unspecified: Secondary | ICD-10-CM

## 2018-10-06 DIAGNOSIS — Z124 Encounter for screening for malignant neoplasm of cervix: Secondary | ICD-10-CM | POA: Diagnosis not present

## 2018-10-06 DIAGNOSIS — Z9189 Other specified personal risk factors, not elsewhere classified: Secondary | ICD-10-CM

## 2018-10-06 DIAGNOSIS — Z803 Family history of malignant neoplasm of breast: Secondary | ICD-10-CM | POA: Insufficient documentation

## 2018-10-06 HISTORY — DX: Encounter for nonprocreative screening for genetic disease carrier status: Z13.71

## 2018-10-06 HISTORY — DX: Other specified personal risk factors, not elsewhere classified: Z91.89

## 2018-10-06 NOTE — Patient Instructions (Signed)
I value your feedback and entrusting us with your care. If you get a [place] patient survey, I would appreciate you taking the time to let us know about your experience today. Thank you! 

## 2018-10-09 ENCOUNTER — Telehealth: Payer: Self-pay | Admitting: Obstetrics and Gynecology

## 2018-10-09 LAB — CYTOLOGY - PAP
Diagnosis: NEGATIVE
HPV 16/18/45 genotyping: NEGATIVE
HPV: DETECTED — AB

## 2018-10-09 NOTE — Telephone Encounter (Signed)
Done

## 2018-10-09 NOTE — Telephone Encounter (Signed)
Patient is calling for labs results. Please advise. 

## 2018-10-14 ENCOUNTER — Encounter: Payer: Self-pay | Admitting: Obstetrics and Gynecology

## 2018-10-21 ENCOUNTER — Telehealth: Payer: Self-pay | Admitting: Obstetrics and Gynecology

## 2018-10-21 NOTE — Telephone Encounter (Signed)
Pt aware of neg MyRisk results. IBIS=18.7%/riskscore=23.9%.  Patient understands these results only apply to her and her children, and this is not indicative of genetic testing results of her other family members. It is recommended that her other family members have genetic testing done.  Pt also understands negative genetic testing doesn't mean she will never get any of these cancers.   Hard copy mailed to pt. F/u prn.

## 2018-11-05 ENCOUNTER — Ambulatory Visit
Admission: RE | Admit: 2018-11-05 | Discharge: 2018-11-05 | Disposition: A | Discharge status: Home / Self Care | Payer: BC Managed Care – PPO | Source: Ambulatory Visit | Attending: Obstetrics and Gynecology | Admitting: Obstetrics and Gynecology

## 2018-11-05 ENCOUNTER — Other Ambulatory Visit: Payer: Self-pay

## 2018-11-05 ENCOUNTER — Encounter: Payer: Self-pay | Admitting: Obstetrics and Gynecology

## 2018-11-05 DIAGNOSIS — Z1231 Encounter for screening mammogram for malignant neoplasm of breast: Secondary | ICD-10-CM | POA: Insufficient documentation

## 2019-07-27 ENCOUNTER — Other Ambulatory Visit: Payer: Self-pay | Admitting: Obstetrics and Gynecology

## 2019-07-27 DIAGNOSIS — Z1231 Encounter for screening mammogram for malignant neoplasm of breast: Secondary | ICD-10-CM

## 2019-10-19 ENCOUNTER — Ambulatory Visit: Payer: BC Managed Care – PPO | Admitting: Obstetrics and Gynecology

## 2019-10-20 ENCOUNTER — Encounter: Payer: Self-pay | Admitting: Obstetrics and Gynecology

## 2019-10-20 NOTE — Progress Notes (Signed)
Chief Complaint  Patient presents with  . Gynecologic Exam     HPI:      [name] is a 64 y.o. G1P1001 who LMP was No LMP recorded. Patient has had a hysterectomy., presents today for her annual examination. Her menses are absent due to [facility] BSO. No PMB. She does not have vasomotor sx.   Sex activity: not sexually active, no vag complaints.  Last Pap: 10/06/18  Results were: no abnormalities /POS HPV DNA. Had neg HPV DNA 2018 and 2019. Repeat vaginal pap due today. 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  Last mammogram: 11/05/18  Results were: normal--routine follow-up in 12 months. Has appt 7/21. There is a FH of breast cancer in her sister, who is BRCA neg 2011 with [facility]. There is a FH of ovarian cancer in her mom. Pt is s/p BSO. Pt is MyRisk neg 2020; IBIS=18.7%/riskscore=23.9%. The patient does do self-breast exams. Has not had scr breast MRI. Her daughter had breast bx yesterday for suspicious breast lesion, awaiting path. There is FH breast cancer on her daughter's pat side.   Colonoscopy: 2018, repeat due after 5 yrs  Tobacco use: The patient denies current or previous tobacco use. Alcohol use: none No drug use Exercise: moderately active  She does get adequate calcium and Vitamin D in her diet. Labs with PCP.    Past Medical History:  Diagnosis Date  . Anxiety   . BRCA negative 10/2018   MyRisk neg  . Depression   . Family history of breast cancer    sister is BRCA neg  . Family history of ovarian cancer    mother, pt is s/p BSO  . GERD (gastroesophageal reflux disease)   . Hyperlipidemia   . Increased risk of breast cancer 10/2018   IBIS=18.7%/riskscore=23.9%  . Thyroid disease   . Vitamin D deficiency     Past Surgical History:  Procedure Laterality Date  . ABDOMINAL HYSTERECTOMY  2016   TAH BSO Dr. Ilda Basset  . BREAST BIOPSY    . BREAST SURGERY Right    breast biospy needle, negative Dr. Bary Castilla  . COLONOSCOPY  09/2016   2 polyps; repeat  in 5 yrs; Dr. Rayann Heman  . TONSILLECTOMY      Family History  Problem Relation Age of Onset  . Breast cancer Sister 38  . Uterine cancer Sister 52  . Ovarian cancer Mother 83    Social History   Socioeconomic History  . Marital status: Divorced    Spouse name: Not on file  . Number of children: Not on file  . Years of education: Not on file  . Highest education level: Not on file  Occupational History  . Not on file  Tobacco Use  . Smoking status: Never Smoker  . Smokeless tobacco: Never Used  Vaping Use  . Vaping Use: Never used  Substance and Sexual Activity  . Alcohol use: No  . Drug use: No  . Sexual activity: Not Currently    Birth control/protection: Surgical    Comment: Hysterectomy  Other Topics Concern  . Not on file  Social History Narrative  . Not on file   Social Determinants of Health   Financial Resource Strain:   . Difficulty of Paying Living Expenses:   Food Insecurity:   . Worried About Charity fundraiser in the Last Year:   . Arboriculturist in the Last Year:   Transportation Needs:   . Film/video editor (Medical):   Marland Kitchen  Lack of Transportation (Non-Medical):   Physical Activity:   . Days of Exercise per Week:   . Minutes of Exercise per Session:   Stress:   . Feeling of Stress :   Social Connections:   . Frequency of Communication with Friends and Family:   . Frequency of Social Gatherings with Friends and Family:   . Attends Religious Services:   . Active Member of Clubs or Organizations:   . Attends Archivist Meetings:   Marland Kitchen Marital Status:   Intimate Partner Violence:   . Fear of Current or Ex-Partner:   . Emotionally Abused:   Marland Kitchen Physically Abused:   . Sexually Abused:     Current Outpatient Medications on File Prior to Visit  Medication Sig Dispense Refill  . augmented betamethasone dipropionate (DIPROLENE-AF) 0.05 % cream APPLY CREAM TOPICALLY TWICE DAILY AS NEEDED TO SPOT TREAT FOR ITCHING    . escitalopram (LEXAPRO)  20 MG tablet   1  . levothyroxine (SYNTHROID, LEVOTHROID) 88 MCG tablet Take by mouth.    . liothyronine (CYTOMEL) 5 MCG tablet   1  . Multiple Vitamin (MULTI-VITAMIN PO) Take by mouth.    Marland Kitchen omeprazole (PRILOSEC) 20 MG capsule     . pravastatin (PRAVACHOL) 40 MG tablet Take 40 mg by mouth at bedtime.    . valACYclovir (VALTREX) 500 MG tablet TAKE 1 CAPLET BY MOUTH ONCE DAILY    . Vitamin D, Ergocalciferol, (DRISDOL) 50000 units CAPS capsule     . cetirizine (ZYRTEC) 10 MG tablet Take by mouth.     No current facility-administered medications on file prior to visit.    ROS:  Review of Systems  Constitutional: Negative for fatigue, fever and unexpected weight change.  Respiratory: Negative for cough, shortness of breath and wheezing.   Cardiovascular: Negative for chest pain, palpitations and leg swelling.  Gastrointestinal: Negative for blood in stool, constipation, diarrhea, nausea and vomiting.  Endocrine: Negative for cold intolerance, heat intolerance and polyuria.  Genitourinary: Negative for dyspareunia, dysuria, flank pain, frequency, genital sores, hematuria, menstrual problem, pelvic pain, urgency, vaginal bleeding, vaginal discharge and vaginal pain.  Musculoskeletal: Negative for back pain, joint swelling and myalgias.  Skin: Negative for rash.  Neurological: Negative for dizziness, syncope, light-headedness, numbness and headaches.  Hematological: Negative for adenopathy.  Psychiatric/Behavioral: Negative for agitation, confusion, sleep disturbance and suicidal ideas. The patient is not nervous/anxious.     Objective: BP 126/80   Ht 5' 7.5" (1.715 m)   Wt 187 lb (84.8 kg)   BMI 28.86 kg/m    Physical Exam Constitutional:      Appearance: She is well-developed.  Genitourinary:     Vulva and vagina normal.     No vaginal discharge, erythema or tenderness.     Cervix is absent.     Uterus is absent.     No right or left adnexal mass present.     Right adnexa  absent.     Right adnexa not tender.     Left adnexa absent.     Left adnexa not tender.     Genitourinary Comments: UTERUS/CX SURG REM  Neck:     Thyroid: No thyromegaly.  Cardiovascular:     Rate and Rhythm: Normal rate and regular rhythm.     Heart sounds: Normal heart sounds. No murmur heard.   Pulmonary:     Effort: Pulmonary effort is normal.     Breath sounds: Normal breath sounds.  Chest:     Breasts:  Right: No mass, nipple discharge, skin change or tenderness.        Left: No mass, nipple discharge, skin change or tenderness.  Abdominal:     Palpations: Abdomen is soft.     Tenderness: There is no abdominal tenderness. There is no guarding.  Musculoskeletal:        General: Normal range of motion.     Cervical back: Normal range of motion.  Neurological:     General: No focal deficit present.     Mental Status: She is alert and oriented to person, place, and time.     Cranial Nerves: No cranial nerve deficit.  Skin:    General: Skin is warm and dry.  Psychiatric:        Mood and Affect: Mood normal.        Behavior: Behavior normal.        Thought Content: Thought content normal.        Judgment: Judgment normal.  Vitals reviewed.     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 Will call with results.   Encounter for screening mammogram for malignant neoplasm of breast; pt has mammo sched.  Family history of breast cancer--Pt is MyRisk neg. Pt's daughter having breast bx. Suggested pt's sister do update testing, which if positive, would decrease pt's risk of breast cancer.  Increased risk of breast cancer--Discussed monthly SBE, yearly CBE and mammos, scr breast MRI. Pt to call for MRI ref if desires. Cont Vit D supp.  Family  history of ovarian cancer--pt is MyRisk neg. No further screening indicated.       GYN counsel breast self exam, mammography screening, menopause, adequate intake of calcium and vitamin D, diet and exercise     F/U  Return in about 1 year (around 10/20/2020).  Nayah Lukens B. Taressa Rauh, PA-C 10/21/2019 11:31 AM

## 2019-10-21 ENCOUNTER — Ambulatory Visit (INDEPENDENT_AMBULATORY_CARE_PROVIDER_SITE_OTHER): Payer: Medicare Other | Admitting: Obstetrics and Gynecology

## 2019-10-21 ENCOUNTER — Other Ambulatory Visit (HOSPITAL_COMMUNITY)
Admission: RE | Admit: 2019-10-21 | Discharge: 2019-10-21 | Disposition: A | Discharge status: Home / Self Care | Payer: Medicare Other | Source: Ambulatory Visit | Attending: Obstetrics and Gynecology | Admitting: Obstetrics and Gynecology

## 2019-10-21 ENCOUNTER — Other Ambulatory Visit: Payer: Self-pay

## 2019-10-21 ENCOUNTER — Encounter: Payer: Self-pay | Admitting: Obstetrics and Gynecology

## 2019-10-21 VITALS — BP 126/80 | Ht 67.5 in | Wt 187.0 lb

## 2019-10-21 DIAGNOSIS — Z803 Family history of malignant neoplasm of breast: Secondary | ICD-10-CM

## 2019-10-21 DIAGNOSIS — R87810 Cervical high risk human papillomavirus (HPV) DNA test positive: Secondary | ICD-10-CM | POA: Insufficient documentation

## 2019-10-21 DIAGNOSIS — Z9189 Other specified personal risk factors, not elsewhere classified: Secondary | ICD-10-CM

## 2019-10-21 DIAGNOSIS — Z01419 Encounter for gynecological examination (general) (routine) without abnormal findings: Secondary | ICD-10-CM | POA: Diagnosis not present

## 2019-10-21 DIAGNOSIS — Z1151 Encounter for screening for human papillomavirus (HPV): Secondary | ICD-10-CM

## 2019-10-21 DIAGNOSIS — R87811 Vaginal high risk human papillomavirus (HPV) DNA test positive: Secondary | ICD-10-CM | POA: Diagnosis not present

## 2019-10-21 DIAGNOSIS — Z124 Encounter for screening for malignant neoplasm of cervix: Secondary | ICD-10-CM | POA: Insufficient documentation

## 2019-10-21 DIAGNOSIS — Z1231 Encounter for screening mammogram for malignant neoplasm of breast: Secondary | ICD-10-CM

## 2019-10-21 DIAGNOSIS — Z8041 Family history of malignant neoplasm of ovary: Secondary | ICD-10-CM

## 2019-10-21 NOTE — Patient Instructions (Signed)
I value your feedback and entrusting us with your care. If you get a  patient survey, I would appreciate you taking the time to let us know about your experience today. Thank you!  As of April 16, 2019, your lab results will be released to your MyChart immediately, before I even have a chance to see them. Please give me time to review them and contact you if there are any abnormalities. Thank you for your patience.  

## 2019-10-22 LAB — CYTOLOGY - PAP
Comment: NEGATIVE
Diagnosis: NEGATIVE
High risk HPV: NEGATIVE

## 2019-11-13 ENCOUNTER — Ambulatory Visit
Admission: RE | Admit: 2019-11-13 | Discharge: 2019-11-13 | Disposition: A | Discharge status: Home / Self Care | Payer: Medicare Other | Source: Ambulatory Visit | Attending: Obstetrics and Gynecology | Admitting: Obstetrics and Gynecology

## 2019-11-13 DIAGNOSIS — Z1231 Encounter for screening mammogram for malignant neoplasm of breast: Secondary | ICD-10-CM | POA: Diagnosis not present

## 2019-11-14 ENCOUNTER — Encounter: Payer: Self-pay | Admitting: Obstetrics and Gynecology

## 2020-07-21 ENCOUNTER — Other Ambulatory Visit: Payer: Self-pay | Admitting: Obstetrics and Gynecology

## 2020-07-21 DIAGNOSIS — Z1231 Encounter for screening mammogram for malignant neoplasm of breast: Secondary | ICD-10-CM

## 2020-10-26 NOTE — Progress Notes (Signed)
Chief Complaint  Patient presents with   Gynecologic Exam    No concerns     HPI:      [name] is a 65 y.o. G1P1001 who LMP was No LMP recorded. Patient has had a hysterectomy., presents today for her annual examination. Her menses are absent due to [facility] BSO. No PMB. She does not have vasomotor sx.   Sex activity: not sexually active, no vag complaints.   Last Pap: 10/21/19  Results were: no abnormalities /NEG HPV DNA. Had pos HPV DNA 6/20; neg HPV DNA 2018 and 2019. Repeat vaginal pap due today. 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   Last mammogram: 11/13/19  Results were: normal--routine follow-up in 12 months. Has appt 7/22 There is a FH of breast and uterine cancer in her sister who is BRCA neg 2011 with [facility], and daughter who was gene neg (also has FH breast cancer on her pat side). There is a FH of ovarian cancer in her mom. Pt is s/p BSO. Pt is MyRisk neg 2020; IBIS=18.7%/riskscore=23.9% (prior to daughter breast cancer dx so higher now). The patient does do self-breast exams. Has not had scr breast MRI.    Colonoscopy: 2018, repeat due after 5 yrs   Tobacco use: The patient denies current or previous tobacco use. Alcohol use: none No drug use Exercise: moderately active   She does get adequate calcium and Vitamin D in her diet. Labs with PCP.    Past Medical History:  Diagnosis Date   Anxiety    BRCA negative 10/2018   MyRisk neg   Depression    Family history of breast cancer    sister is BRCA neg   Family history of ovarian cancer    mother, pt is s/p BSO   GERD (gastroesophageal reflux disease)    Hyperlipidemia    Increased risk of breast cancer 10/2018   IBIS=18.7%/riskscore=23.9%   Thyroid disease    Vitamin D deficiency     Past Surgical History:  Procedure Laterality Date   ABDOMINAL HYSTERECTOMY  2016   TAH BSO Dr. Ilda Basset   BREAST BIOPSY     BREAST SURGERY Right    breast biospy needle, negative Dr. Bary Castilla   COLONOSCOPY  09/2016    2 polyps; repeat in 5 yrs; Dr. Rayann Heman   TONSILLECTOMY      Family History  Problem Relation Age of Onset   Ovarian cancer Mother 52   Breast cancer Sister 34       BRCA neg 2011   Uterine cancer Sister 42   Uterine cancer Sister        not sure of age   Breast cancer Daughter 80       gene neg    Social History   Socioeconomic History   Marital status: Divorced    Spouse name: Not on file   Number of children: Not on file   Years of education: Not on file   Highest education level: Not on file  Occupational History   Not on file  Tobacco Use   Smoking status: Never   Smokeless tobacco: Never  Vaping Use   Vaping Use: Never used  Substance and Sexual Activity   Alcohol use: No   Drug use: No   Sexual activity: Not Currently    Birth control/protection: Surgical    Comment: Hysterectomy  Other Topics Concern   Not on file  Social History Narrative   Not on file   Social  Determinants of Health   Financial Resource Strain: Not on file  Food Insecurity: Not on file  Transportation Needs: Not on file  Physical Activity: Not on file  Stress: Not on file  Social Connections: Not on file  Intimate Partner Violence: Not on file    Current Outpatient Medications on File Prior to Visit  Medication Sig Dispense Refill   augmented betamethasone dipropionate (DIPROLENE-AF) 0.05 % cream APPLY CREAM TOPICALLY TWICE DAILY AS NEEDED TO SPOT TREAT FOR ITCHING     escitalopram (LEXAPRO) 20 MG tablet   1   liothyronine (CYTOMEL) 5 MCG tablet   1   omeprazole (PRILOSEC) 20 MG capsule      Vitamin D, Ergocalciferol, (DRISDOL) 50000 units CAPS capsule      cetirizine (ZYRTEC) 10 MG tablet Take by mouth.     levothyroxine (SYNTHROID, LEVOTHROID) 88 MCG tablet Take by mouth. (Patient not taking: Reported on 10/27/2020)     No current facility-administered medications on file prior to visit.    ROS:  Review of Systems  Constitutional:  Negative for fatigue, fever and  unexpected weight change.  Respiratory:  Negative for cough, shortness of breath and wheezing.   Cardiovascular:  Negative for chest pain, palpitations and leg swelling.  Gastrointestinal:  Negative for blood in stool, constipation, diarrhea, nausea and vomiting.  Endocrine: Negative for cold intolerance, heat intolerance and polyuria.  Genitourinary:  Negative for dyspareunia, dysuria, flank pain, frequency, genital sores, hematuria, menstrual problem, pelvic pain, urgency, vaginal bleeding, vaginal discharge and vaginal pain.  Musculoskeletal:  Negative for back pain, joint swelling and myalgias.  Skin:  Negative for rash.  Neurological:  Negative for dizziness, syncope, light-headedness, numbness and headaches.  Hematological:  Negative for adenopathy.  Psychiatric/Behavioral:  Negative for agitation, confusion, sleep disturbance and suicidal ideas. The patient is not nervous/anxious.    Objective: BP 116/80   Ht $R'5\' 8"'IT$  (1.727 m)   Wt 180 lb (81.6 kg)   BMI 27.37 kg/m    Physical Exam Constitutional:      Appearance: She is well-developed.  Genitourinary:     Vulva normal.     Genitourinary Comments: UTERUS/CX SURG REM     Right Labia: No rash, tenderness or lesions.    Left Labia: No tenderness, lesions or rash.    Vaginal cuff intact.    No vaginal discharge, erythema or tenderness.      Right Adnexa: absent.    Right Adnexa: not tender and no mass present.    Left Adnexa: absent.    Left Adnexa: not tender and no mass present.    Cervix is absent.     Uterus is absent.  Breasts:    Right: No mass, nipple discharge, skin change or tenderness.     Left: No mass, nipple discharge, skin change or tenderness.  Neck:     Thyroid: No thyromegaly.  Cardiovascular:     Rate and Rhythm: Normal rate and regular rhythm.     Heart sounds: Normal heart sounds. No murmur heard. Pulmonary:     Effort: Pulmonary effort is normal.     Breath sounds: Normal breath sounds.   Abdominal:     Palpations: Abdomen is soft.     Tenderness: There is no abdominal tenderness. There is no guarding.  Musculoskeletal:        General: Normal range of motion.     Cervical back: Normal range of motion.  Neurological:     General: No focal deficit present.  Mental Status: She is alert and oriented to person, place, and time.     Cranial Nerves: No cranial nerve deficit.  Skin:    General: Skin is warm and dry.  Psychiatric:        Mood and Affect: Mood normal.        Behavior: Behavior normal.        Thought Content: Thought content normal.        Judgment: Judgment normal.  Vitals reviewed.    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 Will call with results.   High grade squamous intraepithelial lesion (HGSIL), grade 3 CIN, on biopsy of cervix - Plan: Cytology - PAP  Encounter for screening mammogram for malignant neoplasm of breast; pt has mammo sched.  Family history of breast cancer--Pt is MyRisk neg. Daughter is gene neg (has FH on pat side too); pt's sister could do update testing.   Increased risk of breast cancer--Discussed monthly SBE, yearly CBE and mammos, scr breast MRI. Pt would like breast MRI so will do 11/22 after mammo 7/22.  Cont Vit D supp.  Family history of ovarian cancer--pt is MyRisk neg. No further screening indicated.       GYN counsel breast self exam, mammography screening, menopause, adequate intake of calcium and vitamin D, diet and exercise     F/U  Return in about 1 year (around 10/27/2021).  Aneri Slagel B. Giani Betzold, PA-C 10/27/2020 9:39 AM

## 2020-10-27 ENCOUNTER — Other Ambulatory Visit: Payer: Self-pay

## 2020-10-27 ENCOUNTER — Other Ambulatory Visit (HOSPITAL_COMMUNITY)
Admission: RE | Admit: 2020-10-27 | Discharge: 2020-10-27 | Disposition: A | Discharge status: Home / Self Care | Payer: Medicare PPO | Source: Ambulatory Visit | Attending: Obstetrics and Gynecology | Admitting: Obstetrics and Gynecology

## 2020-10-27 ENCOUNTER — Encounter: Payer: Self-pay | Admitting: Obstetrics and Gynecology

## 2020-10-27 ENCOUNTER — Ambulatory Visit (INDEPENDENT_AMBULATORY_CARE_PROVIDER_SITE_OTHER): Payer: Medicare PPO | Admitting: Obstetrics and Gynecology

## 2020-10-27 VITALS — BP 116/80 | Ht 68.0 in | Wt 180.0 lb

## 2020-10-27 DIAGNOSIS — Z1231 Encounter for screening mammogram for malignant neoplasm of breast: Secondary | ICD-10-CM

## 2020-10-27 DIAGNOSIS — Z01419 Encounter for gynecological examination (general) (routine) without abnormal findings: Secondary | ICD-10-CM

## 2020-10-27 DIAGNOSIS — Z9189 Other specified personal risk factors, not elsewhere classified: Secondary | ICD-10-CM

## 2020-10-27 DIAGNOSIS — Z1151 Encounter for screening for human papillomavirus (HPV): Secondary | ICD-10-CM | POA: Diagnosis present

## 2020-10-27 DIAGNOSIS — D069 Carcinoma in situ of cervix, unspecified: Secondary | ICD-10-CM | POA: Insufficient documentation

## 2020-10-27 DIAGNOSIS — R87811 Vaginal high risk human papillomavirus (HPV) DNA test positive: Secondary | ICD-10-CM

## 2020-10-27 DIAGNOSIS — Z124 Encounter for screening for malignant neoplasm of cervix: Secondary | ICD-10-CM | POA: Insufficient documentation

## 2020-10-27 DIAGNOSIS — Z803 Family history of malignant neoplasm of breast: Secondary | ICD-10-CM

## 2020-10-28 LAB — CYTOLOGY - PAP
Comment: NEGATIVE
Diagnosis: NEGATIVE
High risk HPV: NEGATIVE

## 2020-10-29 NOTE — Progress Notes (Signed)
Pls let pt know pap and HPV testing are normal. Will repeat next yr. Thx

## 2020-10-31 NOTE — Progress Notes (Signed)
Pt aware.

## 2020-11-15 ENCOUNTER — Ambulatory Visit
Admission: RE | Admit: 2020-11-15 | Discharge: 2020-11-15 | Disposition: A | Discharge status: Home / Self Care | Payer: Medicare PPO | Source: Ambulatory Visit | Attending: Obstetrics and Gynecology | Admitting: Obstetrics and Gynecology

## 2020-11-15 ENCOUNTER — Other Ambulatory Visit: Payer: Self-pay

## 2020-11-15 DIAGNOSIS — Z1231 Encounter for screening mammogram for malignant neoplasm of breast: Secondary | ICD-10-CM | POA: Insufficient documentation

## 2020-11-18 ENCOUNTER — Encounter: Payer: Self-pay | Admitting: Obstetrics and Gynecology

## 2020-11-28 ENCOUNTER — Telehealth: Payer: Self-pay | Admitting: Obstetrics and Gynecology

## 2020-11-28 DIAGNOSIS — Z9189 Other specified personal risk factors, not elsewhere classified: Secondary | ICD-10-CM

## 2020-11-28 DIAGNOSIS — Z803 Family history of malignant neoplasm of breast: Secondary | ICD-10-CM

## 2020-11-28 DIAGNOSIS — Z1239 Encounter for other screening for malignant neoplasm of breast: Secondary | ICD-10-CM

## 2020-11-28 NOTE — Telephone Encounter (Signed)
Pt had neg mammo 7/22. Had discussed screening breast MRI due to increased risk of breast cancer based on FH. Pt interested in doing it. Order placed for [facility] for [facility] 2022. Can do self pay order if not covered by Medicare (pt needs to check coverage). Will f/u with results.  F/u prn.

## 2021-05-04 ENCOUNTER — Telehealth: Payer: Self-pay

## 2021-05-04 NOTE — Telephone Encounter (Signed)
Pt calling; wants to speak to [facility]'s nurse about MRI she is having in January.  [phone]  Pt would like something called in to help her relax for MRI; she asked if ins had approved it; adv note said she was to call ins to see if covered it; pt states she will and if there is anything else she needs to know to let her know.  Pharm correct in chart.

## 2021-05-09 ENCOUNTER — Other Ambulatory Visit: Payer: Self-pay | Admitting: Obstetrics and Gynecology

## 2021-05-09 MED ORDER — ALPRAZOLAM 0.25 MG PO TABS: 0.2500 mg | ORAL_TABLET | Freq: Two times a day (BID) | ORAL | 0 refills | Status: DC | PRN

## 2021-05-09 NOTE — Telephone Encounter (Signed)
Courtney Woodard, pls notify pt xanax called it. Pt to take 30-60 min before appt. Also, pls notify pt we will work on [employer] if her insurance covers it. Did she find out if her insurance will cover or do we need to do self-pay option?   Courtney Woodard, pls work on authorization if needed with Medicare. Has MRI 05/15/21. If not covered or affordable, can change order to self pay. Thx.

## 2021-05-09 NOTE — Progress Notes (Signed)
Rx xanax to take before breast MRI

## 2021-05-09 NOTE — Telephone Encounter (Signed)
Called pt, no answer, LVMTRC. 

## 2021-05-10 NOTE — Telephone Encounter (Signed)
Pt aware of Rx xanax. Also MRI is covered by her ins per pt (talked with [facility]). Unsure if auth needed, so message sent to [place] to check.

## 2021-05-15 ENCOUNTER — Other Ambulatory Visit: Payer: Self-pay

## 2021-05-15 ENCOUNTER — Ambulatory Visit
Admission: RE | Admit: 2021-05-15 | Discharge: 2021-05-15 | Disposition: A | Discharge status: Home / Self Care | Payer: Medicare PPO | Source: Ambulatory Visit | Attending: Obstetrics and Gynecology | Admitting: Obstetrics and Gynecology

## 2021-05-15 DIAGNOSIS — Z9189 Other specified personal risk factors, not elsewhere classified: Secondary | ICD-10-CM

## 2021-05-15 DIAGNOSIS — Z803 Family history of malignant neoplasm of breast: Secondary | ICD-10-CM

## 2021-05-15 DIAGNOSIS — Z1239 Encounter for other screening for malignant neoplasm of breast: Secondary | ICD-10-CM

## 2021-05-15 MED ORDER — GADOBUTROL 1 MMOL/ML IV SOLN
8.0000 mL | Freq: Once | INTRAVENOUS | Status: AC | PRN
  Administered 2021-05-15: 8 mL via INTRAVENOUS

## 2021-05-16 NOTE — Progress Notes (Signed)
Pt aware.

## 2021-05-16 NOTE — Progress Notes (Signed)
Please let pt know her breast MRI was normal. Mammo due 7/23. F/u prn. Thx

## 2021-11-08 LAB — COLOGUARD: COLOGUARD: NEGATIVE

## 2021-11-14 ENCOUNTER — Ambulatory Visit: Payer: Medicare PPO | Admitting: Obstetrics and Gynecology

## 2021-11-26 NOTE — Progress Notes (Unsigned)
No chief complaint on file.    HPI:      [name] is a 66 y.o. G1P1001 who LMP was No LMP recorded. Patient has had a hysterectomy., presents today for her annual examination. Her menses are absent due to [facility] BSO. No PMB. She does not have vasomotor sx.   Sex activity: not sexually active, no vag complaints.   Last Pap: 10/27/20 and 10/21/19  Results were: no abnormalities /NEG HPV DNA. Had pos HPV DNA 6/20; neg HPV DNA 2018 and 2019. Repeat vaginal pap due today. 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   Last mammogram: 11/15/20  Results were: normal--routine follow-up in 12 months. Had neg breast MRI 1/23 There is a FH of breast and uterine cancer in her sister who is BRCA neg 2011 with [facility], and daughter who was gene neg (also has FH breast cancer on her pat side). There is a FH of ovarian cancer in her mom. Pt is s/p BSO. Pt is MyRisk neg 2020; IBIS=18.7%/riskscore=23.9% (prior to daughter breast cancer dx so higher now). The patient does do self-breast exams. Has not had scr breast MRI.    Colonoscopy: 2018, repeat due after 5 yrs   Tobacco use: The patient denies current or previous tobacco use. Alcohol use: none No drug use Exercise: moderately active   She does get adequate calcium and Vitamin D in her diet. Labs with PCP.    Past Medical History:  Diagnosis Date   Anxiety    BRCA negative 10/2018   MyRisk neg   Depression    Family history of breast cancer    sister is BRCA neg   Family history of ovarian cancer    mother, pt is s/p BSO   GERD (gastroesophageal reflux disease)    Hyperlipidemia    Increased risk of breast cancer 10/2018   IBIS=18.7%/riskscore=23.9%   Thyroid disease    Vitamin D deficiency     Past Surgical History:  Procedure Laterality Date   ABDOMINAL HYSTERECTOMY  2016   TAH BSO Dr. Vergie Living   BREAST BIOPSY     BREAST SURGERY Right    breast biospy needle, negative Dr. Lemar Livings   COLONOSCOPY  09/2016   2 polyps; repeat in 5  yrs; Dr. Shelle Iron   TONSILLECTOMY      Family History  Problem Relation Age of Onset   Ovarian cancer Mother 68   Breast cancer Sister 28       BRCA neg 2011   Uterine cancer Sister 89   Uterine cancer Sister        not sure of age   Breast cancer Daughter 30       gene neg    Social History   Socioeconomic History   Marital status: Divorced    Spouse name: Not on file   Number of children: Not on file   Years of education: Not on file   Highest education level: Not on file  Occupational History   Not on file  Tobacco Use   Smoking status: Never   Smokeless tobacco: Never  Vaping Use   Vaping Use: Never used  Substance and Sexual Activity   Alcohol use: No   Drug use: No   Sexual activity: Not Currently    Birth control/protection: Surgical    Comment: Hysterectomy  Other Topics Concern   Not on file  Social History Narrative   Not on file   Social Determinants of Health   Financial Resource  Strain: Not on file  Food Insecurity: Not on file  Transportation Needs: Not on file  Physical Activity: Not on file  Stress: Not on file  Social Connections: Not on file  Intimate Partner Violence: Not on file    Current Outpatient Medications on File Prior to Visit  Medication Sig Dispense Refill   ALPRAZolam (XANAX) 0.25 MG tablet Take 1 tablet (0.25 mg total) by mouth 2 (two) times daily as needed for anxiety. 5 tablet 0   augmented betamethasone dipropionate (DIPROLENE-AF) 0.05 % cream APPLY CREAM TOPICALLY TWICE DAILY AS NEEDED TO SPOT TREAT FOR ITCHING     cetirizine (ZYRTEC) 10 MG tablet Take by mouth.     escitalopram (LEXAPRO) 20 MG tablet   1   levothyroxine (SYNTHROID, LEVOTHROID) 88 MCG tablet Take by mouth. (Patient not taking: Reported on 10/27/2020)     liothyronine (CYTOMEL) 5 MCG tablet   1   omeprazole (PRILOSEC) 20 MG capsule      Vitamin D, Ergocalciferol, (DRISDOL) 50000 units CAPS capsule      No current facility-administered medications on file  prior to visit.    ROS:  Review of Systems  Constitutional:  Negative for fatigue, fever and unexpected weight change.  Respiratory:  Negative for cough, shortness of breath and wheezing.   Cardiovascular:  Negative for chest pain, palpitations and leg swelling.  Gastrointestinal:  Negative for blood in stool, constipation, diarrhea, nausea and vomiting.  Endocrine: Negative for cold intolerance, heat intolerance and polyuria.  Genitourinary:  Negative for dyspareunia, dysuria, flank pain, frequency, genital sores, hematuria, menstrual problem, pelvic pain, urgency, vaginal bleeding, vaginal discharge and vaginal pain.  Musculoskeletal:  Negative for back pain, joint swelling and myalgias.  Skin:  Negative for rash.  Neurological:  Negative for dizziness, syncope, light-headedness, numbness and headaches.  Hematological:  Negative for adenopathy.  Psychiatric/Behavioral:  Negative for agitation, confusion, sleep disturbance and suicidal ideas. The patient is not nervous/anxious.     Objective: There were no vitals taken for this visit.   Physical Exam Constitutional:      Appearance: She is well-developed.  Genitourinary:     Vulva normal.     Genitourinary Comments: UTERUS/CX SURG REM     Right Labia: No rash, tenderness or lesions.    Left Labia: No tenderness, lesions or rash.    Vaginal cuff intact.    No vaginal discharge, erythema or tenderness.      Right Adnexa: absent.    Right Adnexa: not tender and no mass present.    Left Adnexa: absent.    Left Adnexa: not tender and no mass present.    Cervix is absent.     Uterus is absent.  Breasts:    Right: No mass, nipple discharge, skin change or tenderness.     Left: No mass, nipple discharge, skin change or tenderness.  Neck:     Thyroid: No thyromegaly.  Cardiovascular:     Rate and Rhythm: Normal rate and regular rhythm.     Heart sounds: Normal heart sounds. No murmur heard. Pulmonary:     Effort: Pulmonary  effort is normal.     Breath sounds: Normal breath sounds.  Abdominal:     Palpations: Abdomen is soft.     Tenderness: There is no abdominal tenderness. There is no guarding.  Musculoskeletal:        General: Normal range of motion.     Cervical back: Normal range of motion.  Neurological:     General: No focal  deficit present.     Mental Status: She is alert and oriented to person, place, and time.     Cranial Nerves: No cranial nerve deficit.  Skin:    General: Skin is warm and dry.  Psychiatric:        Mood and Affect: Mood normal.        Behavior: Behavior normal.        Thought Content: Thought content normal.        Judgment: Judgment normal.  Vitals reviewed.     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 Will call with results.   High grade squamous intraepithelial lesion (HGSIL), grade 3 CIN, on biopsy of cervix - Plan: Cytology - PAP  Encounter for screening mammogram for malignant neoplasm of breast; pt has mammo sched.  Family history of breast cancer--Pt is MyRisk neg. Daughter is gene neg (has FH on pat side too); pt's sister could do update testing.   Increased risk of breast cancer--Discussed monthly SBE, yearly CBE and mammos, scr breast MRI. Pt would like breast MRI so will do 11/22 after mammo 7/22.  Cont Vit D supp.  Family history of ovarian cancer--pt is MyRisk neg. No further screening indicated.       GYN counsel breast self exam, mammography screening, menopause, adequate intake of calcium and vitamin D, diet and exercise     F/U  No follow-ups on file.  Mcadoo Muzquiz B. Tovah Slavick, PA-C 11/26/2021 9:56 PM

## 2021-11-30 ENCOUNTER — Ambulatory Visit (INDEPENDENT_AMBULATORY_CARE_PROVIDER_SITE_OTHER): Payer: Medicare PPO | Admitting: Obstetrics and Gynecology

## 2021-11-30 ENCOUNTER — Other Ambulatory Visit (HOSPITAL_COMMUNITY)
Admission: RE | Admit: 2021-11-30 | Discharge: 2021-11-30 | Disposition: A | Discharge status: Home / Self Care | Payer: Medicare PPO | Source: Ambulatory Visit | Attending: Obstetrics and Gynecology | Admitting: Obstetrics and Gynecology

## 2021-11-30 ENCOUNTER — Encounter: Payer: Self-pay | Admitting: Obstetrics and Gynecology

## 2021-11-30 VITALS — BP 90/60 | Ht 68.0 in | Wt 180.0 lb

## 2021-11-30 DIAGNOSIS — Z87410 Personal history of cervical dysplasia: Secondary | ICD-10-CM | POA: Diagnosis not present

## 2021-11-30 DIAGNOSIS — Z1211 Encounter for screening for malignant neoplasm of colon: Secondary | ICD-10-CM

## 2021-11-30 DIAGNOSIS — Z1151 Encounter for screening for human papillomavirus (HPV): Secondary | ICD-10-CM | POA: Insufficient documentation

## 2021-11-30 DIAGNOSIS — Z124 Encounter for screening for malignant neoplasm of cervix: Secondary | ICD-10-CM

## 2021-11-30 DIAGNOSIS — Z9189 Other specified personal risk factors, not elsewhere classified: Secondary | ICD-10-CM

## 2021-11-30 DIAGNOSIS — D069 Carcinoma in situ of cervix, unspecified: Secondary | ICD-10-CM | POA: Insufficient documentation

## 2021-11-30 DIAGNOSIS — Z01419 Encounter for gynecological examination (general) (routine) without abnormal findings: Secondary | ICD-10-CM | POA: Diagnosis present

## 2021-11-30 DIAGNOSIS — Z803 Family history of malignant neoplasm of breast: Secondary | ICD-10-CM

## 2021-11-30 DIAGNOSIS — Z1231 Encounter for screening mammogram for malignant neoplasm of breast: Secondary | ICD-10-CM

## 2021-11-30 NOTE — Patient Instructions (Signed)
I value your feedback and you entrusting us with your care. If you get a [facility] patient survey, I would appreciate you taking the time to let us know about your experience today. Thank you!  [facility]: [phone]      

## 2021-12-04 LAB — CYTOLOGY - PAP
Comment: NEGATIVE
Diagnosis: NEGATIVE
High risk HPV: NEGATIVE

## 2021-12-28 ENCOUNTER — Ambulatory Visit
Admission: RE | Admit: 2021-12-28 | Discharge: 2021-12-28 | Disposition: A | Discharge status: Home / Self Care | Payer: Medicare PPO | Source: Ambulatory Visit | Attending: Obstetrics and Gynecology | Admitting: Obstetrics and Gynecology

## 2021-12-28 DIAGNOSIS — Z1231 Encounter for screening mammogram for malignant neoplasm of breast: Secondary | ICD-10-CM | POA: Diagnosis present

## 2021-12-28 DIAGNOSIS — Z9189 Other specified personal risk factors, not elsewhere classified: Secondary | ICD-10-CM | POA: Diagnosis present

## 2021-12-28 DIAGNOSIS — Z803 Family history of malignant neoplasm of breast: Secondary | ICD-10-CM | POA: Insufficient documentation

## 2021-12-29 ENCOUNTER — Encounter: Payer: Self-pay | Admitting: Obstetrics and Gynecology

## 2022-10-25 ENCOUNTER — Other Ambulatory Visit: Payer: Self-pay | Admitting: Obstetrics and Gynecology

## 2022-10-25 DIAGNOSIS — Z1231 Encounter for screening mammogram for malignant neoplasm of breast: Secondary | ICD-10-CM

## 2022-12-24 ENCOUNTER — Encounter: Payer: Self-pay | Admitting: Obstetrics and Gynecology

## 2022-12-24 ENCOUNTER — Ambulatory Visit (INDEPENDENT_AMBULATORY_CARE_PROVIDER_SITE_OTHER): Payer: Medicare PPO | Admitting: Obstetrics and Gynecology

## 2022-12-24 ENCOUNTER — Other Ambulatory Visit (HOSPITAL_COMMUNITY)
Admission: RE | Admit: 2022-12-24 | Discharge: 2022-12-24 | Disposition: A | Discharge status: Home / Self Care | Payer: Medicare PPO | Source: Ambulatory Visit | Attending: Obstetrics and Gynecology | Admitting: Obstetrics and Gynecology

## 2022-12-24 VITALS — BP 133/93 | HR 86 | Resp 16 | Ht 67.0 in | Wt 188.5 lb

## 2022-12-24 DIAGNOSIS — Z01419 Encounter for gynecological examination (general) (routine) without abnormal findings: Secondary | ICD-10-CM | POA: Diagnosis not present

## 2022-12-24 DIAGNOSIS — Z803 Family history of malignant neoplasm of breast: Secondary | ICD-10-CM

## 2022-12-24 DIAGNOSIS — D069 Carcinoma in situ of cervix, unspecified: Secondary | ICD-10-CM

## 2022-12-24 DIAGNOSIS — Z124 Encounter for screening for malignant neoplasm of cervix: Secondary | ICD-10-CM | POA: Diagnosis present

## 2022-12-24 DIAGNOSIS — Z1231 Encounter for screening mammogram for malignant neoplasm of breast: Secondary | ICD-10-CM

## 2022-12-24 DIAGNOSIS — Z9189 Other specified personal risk factors, not elsewhere classified: Secondary | ICD-10-CM

## 2022-12-24 DIAGNOSIS — Z1211 Encounter for screening for malignant neoplasm of colon: Secondary | ICD-10-CM

## 2022-12-24 NOTE — Progress Notes (Signed)
Chief Complaint  Patient presents with   Annual Exam     HPI:      [name] is a 67 y.o. G1P1001 who LMP was No LMP recorded. Patient has had a hysterectomy., presents today for her Medicare annual examination. Her menses are absent due to [facility] BSO. No PMB. She does not have vasomotor sx.   Sex activity: not sexually active, no vag complaints.   Last Pap: 11/30/21, 10/27/20 and 10/21/19  Results were: no abnormalities /NEG HPV DNA. Repeat Q3 yrs per [facility] (2026) but pt prefers to have done today. Had pos HPV DNA 6/20; neg HPV DNA 2018 and 2019.  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   Last mammogram: 12/28/21  Results were: normal--routine follow-up in 12 months. Had neg breast MRI 1/23; has mammo appt 8/24 There is a FH of breast and uterine cancer in her sister who is BRCA neg 2011 with [facility], and daughter with breast cancer who was gene neg (also has FH breast cancer on her pat side). There is a FH of ovarian cancer in her mom. Pt is s/p BSO. Pt is MyRisk neg 2020; IBIS=18.7%/riskscore=23.9% (prior to daughter breast cancer dx so higher now). The patient does do self-breast exams.    Colonoscopy: 1/24 and 2018 at [facility], repeat due after 5 yrs. Has ext hemorrhoid, not bothersome to pt.   Tobacco use: The patient denies current or previous tobacco use. Alcohol use: none No drug use Exercise: moderately active   She does get adequate calcium and Vitamin D in her diet. Labs with Dr. Patrecia Pace; DEXA with Dr. Patrecia Pace; he is retiring, pt looking for new PCP   Past Medical History:  Diagnosis Date   Anxiety    BRCA negative 10/2018   MyRisk neg   Depression    Family history of breast cancer    sister is BRCA neg   Family history of ovarian cancer    mother, pt is s/p BSO   GERD (gastroesophageal reflux disease)    Hyperlipidemia    Increased risk of breast cancer 10/2018   IBIS=18.7%/riskscore=23.9%   Thyroid disease    Vitamin D deficiency     Past Surgical  History:  Procedure Laterality Date   ABDOMINAL HYSTERECTOMY  2016   TAH BSO Dr. Vergie Living   BREAST BIOPSY     BREAST SURGERY Right    breast biospy needle, negative Dr. Lemar Livings   COLONOSCOPY  09/2016   2 polyps; repeat in 5 yrs; Dr. Shelle Iron   TONSILLECTOMY      Family History  Problem Relation Age of Onset   Ovarian cancer Mother 54   Breast cancer Sister 79       BRCA neg 2011   Uterine cancer Sister 17   Uterine cancer Sister        not sure of age   Breast cancer Daughter 30       gene neg    Social History   Socioeconomic History   Marital status: Divorced    Spouse name: Not on file   Number of children: Not on file   Years of education: Not on file   Highest education level: Not on file  Occupational History   Not on file  Tobacco Use   Smoking status: Never   Smokeless tobacco: Never  Vaping Use   Vaping status: Never Used  Substance and Sexual Activity   Alcohol use: No   Drug use: No   Sexual activity:  Not Currently    Birth control/protection: Surgical    Comment: Hysterectomy  Other Topics Concern   Not on file  Social History Narrative   Not on file   Social Determinants of Health   Financial Resource Strain: Not on file  Food Insecurity: Not on file  Transportation Needs: Not on file  Physical Activity: Not on file  Stress: Not on file  Social Connections: Not on file  Intimate Partner Violence: Not on file    Current Outpatient Medications on File Prior to Visit  Medication Sig Dispense Refill   buPROPion (WELLBUTRIN XL) 150 MG 24 hr tablet SMARTSIG:1 Tablet(s) By Mouth Every Evening     escitalopram (LEXAPRO) 20 MG tablet   1   NP THYROID 90 MG tablet Take 90 mg by mouth every morning.     omeprazole (PRILOSEC) 20 MG capsule      rosuvastatin (CRESTOR) 20 MG tablet Take 20 mg by mouth daily.     VASCEPA 1 g capsule Take 1 g by mouth at bedtime.     Vitamin D, Ergocalciferol, (DRISDOL) 50000 units CAPS capsule      cetirizine (ZYRTEC) 10  MG tablet Take by mouth.     No current facility-administered medications on file prior to visit.    ROS:  Review of Systems  Constitutional:  Negative for fatigue, fever and unexpected weight change.  Respiratory:  Negative for cough, shortness of breath and wheezing.   Cardiovascular:  Negative for chest pain, palpitations and leg swelling.  Gastrointestinal:  Negative for blood in stool, constipation, diarrhea, nausea and vomiting.  Endocrine: Negative for cold intolerance, heat intolerance and polyuria.  Genitourinary:  Negative for dyspareunia, dysuria, flank pain, frequency, genital sores, hematuria, menstrual problem, pelvic pain, urgency, vaginal bleeding, vaginal discharge and vaginal pain.  Musculoskeletal:  Negative for back pain, joint swelling and myalgias.  Skin:  Negative for rash.  Neurological:  Negative for dizziness, syncope, light-headedness, numbness and headaches.  Hematological:  Negative for adenopathy.  Psychiatric/Behavioral:  Negative for agitation, confusion, sleep disturbance and suicidal ideas. The patient is not nervous/anxious.     Objective: BP (!) 133/93   Pulse 86   Resp 16   Ht 5\' 7"  (1.702 m)   Wt 188 lb 8 oz (85.5 kg)   BMI 29.52 kg/m    Physical Exam Constitutional:      Appearance: She is well-developed.  Genitourinary:     Vulva normal.     Genitourinary Comments: UTERUS/CX SURG REM     Right Labia: No rash, tenderness or lesions.    Left Labia: No tenderness, lesions or rash.    Vaginal cuff intact.    No vaginal discharge, erythema or tenderness.      Right Adnexa: absent.    Right Adnexa: not tender and no mass present.    Left Adnexa: absent.    Left Adnexa: not tender and no mass present.    Cervix is absent.     Uterus is absent.  Rectum:     External hemorrhoid present.  Breasts:    Right: No mass, nipple discharge, skin change or tenderness.     Left: No mass, nipple discharge, skin change or tenderness.  Neck:      Thyroid: No thyromegaly.  Cardiovascular:     Rate and Rhythm: Normal rate and regular rhythm.     Heart sounds: Normal heart sounds. No murmur heard. Pulmonary:     Effort: Pulmonary effort is normal.     Breath  sounds: Normal breath sounds.  Abdominal:     Palpations: Abdomen is soft.     Tenderness: There is no abdominal tenderness. There is no guarding.  Musculoskeletal:        General: Normal range of motion.     Cervical back: Normal range of motion.  Neurological:     General: No focal deficit present.     Mental Status: She is alert and oriented to person, place, and time.     Cranial Nerves: No cranial nerve deficit.  Skin:    General: Skin is warm and dry.  Psychiatric:        Mood and Affect: Mood normal.        Behavior: Behavior normal.        Thought Content: Thought content normal.        Judgment: Judgment normal.  Vitals reviewed.     Assessment/Plan: Encounter for annual routine gynecological examination  Cervical cancer screening - Plan: Cytology - PAP  High grade squamous intraepithelial lesion (HGSIL), grade 3 CIN, on biopsy of cervix - Plan: Cytology - PAP; repeat pap done today per pt pref  Encounter for screening mammogram for malignant neoplasm of breast; pt has mammo appt  Family history of breast cancer--Pt is MyRisk neg. Daughter is gene neg (has FH on pat side too); pt's sister could do update testing.   Increased risk of breast cancer--Discussed monthly SBE, yearly CBE and mammos, scr breast MRI. Will call for breast MRI if desires. Cont Vit D supp.  Family history of ovarian cancer--pt is MyRisk neg. No further screening indicated.       GYN counsel breast self exam, mammography screening, menopause, adequate intake of calcium and vitamin D, diet and exercise     F/U  Return in about 1 year (around 12/24/2023).  Daeshon Grammatico B. Henrick Mcgue, PA-C 12/24/2022 3:31 PM

## 2022-12-24 NOTE — Patient Instructions (Signed)
I value your feedback and you entrusting us with your care. If you get a [facility] patient survey, I would appreciate you taking the time to let us know about your experience today. Thank you! ? ? ?

## 2022-12-25 ENCOUNTER — Ambulatory Visit: Payer: Medicare PPO | Admitting: Obstetrics and Gynecology

## 2022-12-26 LAB — CYTOLOGY - PAP: Diagnosis: NEGATIVE

## 2023-01-02 ENCOUNTER — Ambulatory Visit
Admission: RE | Admit: 2023-01-02 | Discharge: 2023-01-02 | Disposition: A | Discharge status: Home / Self Care | Payer: Medicare PPO | Source: Ambulatory Visit | Attending: Obstetrics and Gynecology | Admitting: Obstetrics and Gynecology

## 2023-01-02 DIAGNOSIS — Z1231 Encounter for screening mammogram for malignant neoplasm of breast: Secondary | ICD-10-CM | POA: Diagnosis present

## 2023-01-03 ENCOUNTER — Other Ambulatory Visit: Payer: Self-pay | Admitting: Obstetrics and Gynecology

## 2023-01-03 DIAGNOSIS — R928 Other abnormal and inconclusive findings on diagnostic imaging of breast: Secondary | ICD-10-CM

## 2023-01-09 ENCOUNTER — Ambulatory Visit: Admission: RE | Admit: 2023-01-09 | Payer: Medicare PPO | Source: Ambulatory Visit

## 2023-01-09 ENCOUNTER — Ambulatory Visit
Admission: RE | Admit: 2023-01-09 | Discharge: 2023-01-09 | Disposition: A | Discharge status: Home / Self Care | Payer: Medicare PPO | Source: Ambulatory Visit | Attending: Obstetrics and Gynecology | Admitting: Obstetrics and Gynecology

## 2023-01-09 DIAGNOSIS — R928 Other abnormal and inconclusive findings on diagnostic imaging of breast: Secondary | ICD-10-CM | POA: Diagnosis present

## 2023-01-10 ENCOUNTER — Encounter: Payer: Self-pay | Admitting: Obstetrics and Gynecology

## 2023-11-27 ENCOUNTER — Other Ambulatory Visit: Payer: Self-pay | Admitting: Obstetrics and Gynecology

## 2023-11-27 DIAGNOSIS — Z1231 Encounter for screening mammogram for malignant neoplasm of breast: Secondary | ICD-10-CM

## 2024-01-08 NOTE — Progress Notes (Unsigned)
 No chief complaint on file.    HPI:      Courtney Woodard is a 68 y.o. G1P1001 who LMP was No LMP recorded. Patient has had a hysterectomy., presents today for her Medicare annual examination. Her menses are absent due to [facility] BSO. No PMB. She does not have vasomotor sx.   Sex activity: not sexually active, no vag complaints.   Last Pap: 12/24/22, 11/30/21, 10/27/20 and 10/21/19  Results were: no abnormalities /NEG HPV DNA 2023. Repeat Q3 yrs per [facility] (2027) but pt prefers to have done today. Had pos HPV DNA 6/20; neg HPV DNA 2018 and 2019.  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   Last mammogram: 01/09/23  Results were: normal after addl imaging--routine follow-up in 12 months. Had neg breast MRI 1/23 There is a FH of breast and uterine cancer in her sister who is BRCA neg 2011 with [facility], and daughter with breast cancer who was gene neg (also has FH breast cancer on her pat side). There is a FH of ovarian cancer in her mom. Pt is s/p BSO. Pt is MyRisk neg 2020; IBIS=18.7%/riskscore=23.9% (prior to daughter breast cancer dx so higher now). The patient does do self-breast exams.    Colonoscopy: 1/24 and 2018 at [facility] repeat due after 5 yrs. Has ext hemorrhoid, not bothersome to pt.   Tobacco use: The patient denies current or previous tobacco use. Alcohol use: none No drug use Exercise: moderately active   She does get adequate calcium and Vitamin D in her diet. Labs with Dr. Christi; DEXA with Dr. Christi; he is retiring, pt looking for new PCP   Past Medical History:  Diagnosis Date   Anxiety    BRCA negative 10/2018   MyRisk neg   Depression    Family history of breast cancer    sister is BRCA neg   Family history of ovarian cancer    mother, pt is s/p BSO   GERD (gastroesophageal reflux disease)    Hyperlipidemia    Increased risk of breast cancer 10/2018   IBIS=18.7%/riskscore=23.9%   Thyroid disease    Vitamin D deficiency     Past Surgical History:  Procedure  Laterality Date   ABDOMINAL HYSTERECTOMY  2016   TAH BSO Dr. Izell   BREAST BIOPSY     BREAST SURGERY Right    breast biospy needle, negative Dr. Dessa   COLONOSCOPY  09/2016   2 polyps; repeat in 5 yrs; Dr. Jeri   TONSILLECTOMY      Family History  Problem Relation Age of Onset   Ovarian cancer Mother 34   Breast cancer Sister 54       BRCA neg 2011   Uterine cancer Sister 20   Uterine cancer Sister        not sure of age   Breast cancer Daughter 30       gene neg    Social History   Socioeconomic History   Marital status: Divorced    Spouse name: Not on file   Number of children: Not on file   Years of education: Not on file   Highest education level: Not on file  Occupational History   Not on file  Tobacco Use   Smoking status: Never   Smokeless tobacco: Never  Vaping Use   Vaping status: Never Used  Substance and Sexual Activity   Alcohol use: No   Drug use: No   Sexual activity: Not Currently  Birth control/protection: Surgical    Comment: Hysterectomy  Other Topics Concern   Not on file  Social History Narrative   Not on file   Social Drivers of Health   Financial Resource Strain: Not on file  Food Insecurity: No Food Insecurity (06/04/2023)   Received from [facility]   Hunger Vital Sign    Within the past 12 months, you worried that your food would run out before you got the money to buy more.: Never true    Within the past 12 months, the food you bought just didn't last and you didn't have money to get more.: Never true  Transportation Needs: No Transportation Needs (06/04/2023)   Received from [facility] - Transportation    Lack of Transportation (Medical): No    Lack of Transportation (Non-Medical): No  Physical Activity: Not on file  Stress: Not on file  Social Connections: Not on file  Intimate Partner Violence: Not on file    Current Outpatient Medications on File Prior to Visit  Medication Sig Dispense Refill    buPROPion (WELLBUTRIN XL) 150 MG 24 hr tablet SMARTSIG:1 Tablet(s) By Mouth Every Evening     cetirizine (ZYRTEC) 10 MG tablet Take by mouth.     escitalopram (LEXAPRO) 20 MG tablet   1   NP THYROID 90 MG tablet Take 90 mg by mouth every morning.     omeprazole (PRILOSEC) 20 MG capsule      rosuvastatin (CRESTOR) 20 MG tablet Take 20 mg by mouth daily.     VASCEPA 1 g capsule Take 1 g by mouth at bedtime.     Vitamin D, Ergocalciferol, (DRISDOL) 50000 units CAPS capsule      No current facility-administered medications on file prior to visit.    ROS:  Review of Systems  Constitutional:  Negative for fatigue, fever and unexpected weight change.  Respiratory:  Negative for cough, shortness of breath and wheezing.   Cardiovascular:  Negative for chest pain, palpitations and leg swelling.  Gastrointestinal:  Negative for blood in stool, constipation, diarrhea, nausea and vomiting.  Endocrine: Negative for cold intolerance, heat intolerance and polyuria.  Genitourinary:  Negative for dyspareunia, dysuria, flank pain, frequency, genital sores, hematuria, menstrual problem, pelvic pain, urgency, vaginal bleeding, vaginal discharge and vaginal pain.  Musculoskeletal:  Negative for back pain, joint swelling and myalgias.  Skin:  Negative for rash.  Neurological:  Negative for dizziness, syncope, light-headedness, numbness and headaches.  Hematological:  Negative for adenopathy.  Psychiatric/Behavioral:  Negative for agitation, confusion, sleep disturbance and suicidal ideas. The patient is not nervous/anxious.     Objective: There were no vitals taken for this visit.   Physical Exam Constitutional:      Appearance: She is well-developed.  Genitourinary:     Vulva normal.     Genitourinary Comments: UTERUS/CX SURG REM     Right Labia: No rash, tenderness or lesions.    Left Labia: No tenderness, lesions or rash.    Vaginal cuff intact.    No vaginal discharge, erythema or  tenderness.      Right Adnexa: absent.    Right Adnexa: not tender and no mass present.    Left Adnexa: absent.    Left Adnexa: not tender and no mass present.    Cervix is absent.     Uterus is absent.  Rectum:     External hemorrhoid present.  Breasts:    Right: No mass, nipple discharge, skin change or tenderness.  Left: No mass, nipple discharge, skin change or tenderness.  Neck:     Thyroid: No thyromegaly.  Cardiovascular:     Rate and Rhythm: Normal rate and regular rhythm.     Heart sounds: Normal heart sounds. No murmur heard. Pulmonary:     Effort: Pulmonary effort is normal.     Breath sounds: Normal breath sounds.  Abdominal:     Palpations: Abdomen is soft.     Tenderness: There is no abdominal tenderness. There is no guarding.  Musculoskeletal:        General: Normal range of motion.     Cervical back: Normal range of motion.  Neurological:     General: No focal deficit present.     Mental Status: She is alert and oriented to person, place, and time.     Cranial Nerves: No cranial nerve deficit.  Skin:    General: Skin is warm and dry.  Psychiatric:        Mood and Affect: Mood normal.        Behavior: Behavior normal.        Thought Content: Thought content normal.        Judgment: Judgment normal.  Vitals reviewed.     Assessment/Plan: Encounter for annual routine gynecological examination  Cervical cancer screening - Plan: Cytology - PAP  High grade squamous intraepithelial lesion (HGSIL), grade 3 CIN, on biopsy of cervix - Plan: Cytology - PAP; repeat pap done today per pt pref  Encounter for screening mammogram for malignant neoplasm of breast; pt has mammo appt  Family history of breast cancer--Pt is MyRisk neg. Daughter is gene neg (has FH on pat side too); pt's sister could do update testing.   Increased risk of breast cancer--Discussed monthly SBE, yearly CBE and mammos, scr breast MRI. Will call for breast MRI if desires. Cont Vit D  supp.  Family history of ovarian cancer--pt is MyRisk neg. No further screening indicated.       GYN counsel breast self exam, mammography screening, menopause, adequate intake of calcium and vitamin D, diet and exercise     F/U  No follow-ups on file.  Nishawn Rotan B. Draedyn Weidinger, PA-C 01/08/2024 12:20 PM

## 2024-01-09 ENCOUNTER — Encounter: Payer: Self-pay | Admitting: Obstetrics and Gynecology

## 2024-01-09 ENCOUNTER — Other Ambulatory Visit (HOSPITAL_COMMUNITY)
Admission: RE | Admit: 2024-01-09 | Discharge: 2024-01-09 | Disposition: A | Discharge status: Home / Self Care | Source: Ambulatory Visit | Attending: Obstetrics and Gynecology | Admitting: Obstetrics and Gynecology

## 2024-01-09 ENCOUNTER — Ambulatory Visit: Admitting: Obstetrics and Gynecology

## 2024-01-09 VITALS — BP 124/78 | HR 74 | Ht 68.0 in | Wt 186.0 lb

## 2024-01-09 DIAGNOSIS — D069 Carcinoma in situ of cervix, unspecified: Secondary | ICD-10-CM

## 2024-01-09 DIAGNOSIS — Z9189 Other specified personal risk factors, not elsewhere classified: Secondary | ICD-10-CM

## 2024-01-09 DIAGNOSIS — Z01419 Encounter for gynecological examination (general) (routine) without abnormal findings: Secondary | ICD-10-CM | POA: Insufficient documentation

## 2024-01-09 DIAGNOSIS — Z1272 Encounter for screening for malignant neoplasm of vagina: Secondary | ICD-10-CM | POA: Diagnosis not present

## 2024-01-09 DIAGNOSIS — Z1231 Encounter for screening mammogram for malignant neoplasm of breast: Secondary | ICD-10-CM

## 2024-01-09 DIAGNOSIS — Z803 Family history of malignant neoplasm of breast: Secondary | ICD-10-CM

## 2024-01-09 NOTE — Patient Instructions (Signed)
 I value your feedback and you entrusting [place] with your care. If you get a [facility] patient survey, I would appreciate you taking the time to let us know about your experience today. Thank you! ? ? ?

## 2024-01-10 LAB — CYTOLOGY - PAP: Diagnosis: NEGATIVE

## 2024-01-14 ENCOUNTER — Encounter

## 2024-01-15 ENCOUNTER — Ambulatory Visit
Admission: RE | Admit: 2024-01-15 | Discharge: 2024-01-15 | Disposition: A | Discharge status: Home / Self Care | Source: Ambulatory Visit | Attending: Obstetrics and Gynecology | Admitting: Obstetrics and Gynecology

## 2024-01-15 DIAGNOSIS — Z1231 Encounter for screening mammogram for malignant neoplasm of breast: Secondary | ICD-10-CM | POA: Diagnosis present

## 2024-01-19 ENCOUNTER — Ambulatory Visit: Payer: Self-pay | Admitting: Obstetrics and Gynecology

## 2024-03-24 ENCOUNTER — Encounter: Payer: Self-pay | Admitting: Ophthalmology

## 2024-03-24 NOTE — Discharge Instructions (Signed)

## 2024-03-24 NOTE — Anesthesia Preprocedure Evaluation (Signed)
 Anesthesia Evaluation  Patient identified by MRN, date of birth, ID band Patient awake    Reviewed: Allergy & Precautions, H&P , NPO status , Patient's Chart, lab work & pertinent test results  Airway Mallampati: II  TM Distance: >3 FB Neck ROM: Full    Dental no notable dental hx.    Pulmonary neg pulmonary ROS   Pulmonary exam normal breath sounds clear to auscultation       Cardiovascular negative cardio ROS Normal cardiovascular exam Rhythm:Regular Rate:Normal     Neuro/Psych  PSYCHIATRIC DISORDERS Anxiety     negative neurological ROS  negative psych ROS   GI/Hepatic negative GI ROS, Neg liver ROS,GERD  ,,  Endo/Other  negative endocrine ROSHypothyroidism    Renal/GU negative Renal ROS  negative genitourinary   Musculoskeletal negative musculoskeletal ROS (+)    Abdominal   Peds negative pediatric ROS (+)  Hematology negative hematology ROS (+)   Anesthesia Other Findings GERD (gastroesophageal reflux disease) Vitamin D deficiency Hyperlipidemia  Thyroid disease Anxiety   Hypothyroidism Generalized anxiety disorder IBS (irritable bowel syndrome) Hives     Reproductive/Obstetrics negative OB ROS                              Anesthesia Physical Anesthesia Plan  ASA: 2  Anesthesia Plan: MAC   Post-op Pain Management:    Induction: Intravenous  PONV Risk Score and Plan:   Airway Management Planned: Natural Airway and Nasal Cannula  Additional Equipment:   Intra-op Plan:   Post-operative Plan:   Informed Consent: I have reviewed the patients History and Physical, chart, labs and discussed the procedure including the risks, benefits and alternatives for the proposed anesthesia with the patient or authorized representative who has indicated his/her understanding and acceptance.     Dental Advisory Given  Plan Discussed with: Anesthesiologist, CRNA and  Surgeon  Anesthesia Plan Comments: (Patient consented for risks of anesthesia including but not limited to:  - adverse reactions to medications - damage to eyes, teeth, lips or other oral mucosa - nerve damage due to positioning  - sore throat or hoarseness - Damage to heart, brain, nerves, lungs, other parts of body or loss of life  Patient voiced understanding and assent.)         Anesthesia Quick Evaluation

## 2024-03-26 ENCOUNTER — Ambulatory Visit
Admission: RE | Admit: 2024-03-26 | Discharge: 2024-03-26 | Disposition: A | Discharge status: Home / Self Care | Attending: Ophthalmology | Admitting: Ophthalmology

## 2024-03-26 ENCOUNTER — Encounter: Payer: Self-pay | Admitting: Ophthalmology

## 2024-03-26 ENCOUNTER — Ambulatory Visit: Payer: Self-pay | Admitting: Anesthesiology

## 2024-03-26 ENCOUNTER — Encounter: Payer: Self-pay | Admitting: Anesthesiology

## 2024-03-26 ENCOUNTER — Other Ambulatory Visit: Payer: Self-pay

## 2024-03-26 ENCOUNTER — Encounter
Admission: RE | Disposition: A | Discharge status: Home / Self Care | Payer: Self-pay | Source: Home / Self Care | Attending: Ophthalmology

## 2024-03-26 DIAGNOSIS — H25012 Cortical age-related cataract, left eye: Secondary | ICD-10-CM | POA: Diagnosis present

## 2024-03-26 DIAGNOSIS — H2512 Age-related nuclear cataract, left eye: Secondary | ICD-10-CM | POA: Insufficient documentation

## 2024-03-26 DIAGNOSIS — K219 Gastro-esophageal reflux disease without esophagitis: Secondary | ICD-10-CM | POA: Diagnosis not present

## 2024-03-26 HISTORY — DX: Urticaria, unspecified: L50.9

## 2024-03-26 HISTORY — DX: Generalized anxiety disorder: F41.1

## 2024-03-26 HISTORY — DX: Irritable bowel syndrome, unspecified: K58.9

## 2024-03-26 HISTORY — DX: Hypothyroidism, unspecified: E03.9

## 2024-03-26 HISTORY — PX: CATARACT EXTRACTION W/PHACO: SHX586

## 2024-03-26 SURGERY — PHACOEMULSIFICATION, CATARACT, WITH IOL INSERTION
Anesthesia: Monitor Anesthesia Care | Site: Eye | Laterality: Left

## 2024-03-26 MED ORDER — FENTANYL CITRATE (PF) 100 MCG/2ML IJ SOLN
INTRAMUSCULAR | Status: AC
  Filled 2024-03-26: qty 2

## 2024-03-26 MED ORDER — BRIMONIDINE TARTRATE-TIMOLOL 0.2-0.5 % OP SOLN
OPHTHALMIC | Status: DC | PRN
  Administered 2024-03-26: 1 [drp] via OPHTHALMIC

## 2024-03-26 MED ORDER — FENTANYL CITRATE (PF) 100 MCG/2ML IJ SOLN
INTRAMUSCULAR | Status: DC | PRN
  Administered 2024-03-26 (×2): 50 ug via INTRAVENOUS

## 2024-03-26 MED ORDER — CYCLOPENTOLATE HCL 2 % OP SOLN
1.0000 [drp] | OPHTHALMIC | Status: AC
  Administered 2024-03-26 (×3): 1 [drp] via OPHTHALMIC

## 2024-03-26 MED ORDER — LIDOCAINE HCL (PF) 2 % IJ SOLN
INTRAOCULAR | Status: DC | PRN
  Administered 2024-03-26: 4 mL via INTRAOCULAR

## 2024-03-26 MED ORDER — MIDAZOLAM HCL 2 MG/2ML IJ SOLN
INTRAMUSCULAR | Status: AC
  Filled 2024-03-26: qty 2

## 2024-03-26 MED ORDER — TETRACAINE HCL 0.5 % OP SOLN
1.0000 [drp] | OPHTHALMIC | Status: DC | PRN
  Administered 2024-03-26 (×3): 1 [drp] via OPHTHALMIC

## 2024-03-26 MED ORDER — LACTATED RINGERS IV SOLN: INTRAVENOUS | Status: DC

## 2024-03-26 MED ORDER — MOXIFLOXACIN HCL 0.5 % OP SOLN
OPHTHALMIC | Status: DC | PRN
  Administered 2024-03-26: .2 mL via OPHTHALMIC

## 2024-03-26 MED ORDER — CYCLOPENTOLATE HCL 2 % OP SOLN
OPHTHALMIC | Status: AC
Start: 2024-03-26 — End: 2024-03-26
  Filled 2024-03-26: qty 2

## 2024-03-26 MED ORDER — PHENYLEPHRINE HCL 10 % OP SOLN
OPHTHALMIC | Status: AC
  Filled 2024-03-26: qty 5

## 2024-03-26 MED ORDER — SIGHTPATH DOSE#1 NA HYALUR & NA CHOND-NA HYALUR IO KIT
PACK | INTRAOCULAR | Status: DC | PRN
  Administered 2024-03-26: 1 via OPHTHALMIC

## 2024-03-26 MED ORDER — PHENYLEPHRINE-KETOROLAC 1-0.3 % IO SOLN
INTRAOCULAR | Status: DC | PRN
  Administered 2024-03-26: 85 mL via OPHTHALMIC

## 2024-03-26 MED ORDER — TETRACAINE HCL 0.5 % OP SOLN
OPHTHALMIC | Status: AC
  Filled 2024-03-26: qty 4

## 2024-03-26 MED ORDER — MIDAZOLAM HCL (PF) 2 MG/2ML IJ SOLN
INTRAMUSCULAR | Status: DC | PRN
Start: 2024-03-26 — End: 2024-03-26
  Administered 2024-03-26: 2 mg via INTRAVENOUS

## 2024-03-26 MED ORDER — PHENYLEPHRINE HCL 10 % OP SOLN
1.0000 [drp] | OPHTHALMIC | Status: AC
  Administered 2024-03-26 (×3): 1 [drp] via OPHTHALMIC

## 2024-03-26 MED ORDER — SIGHTPATH DOSE#1 BSS IO SOLN
INTRAOCULAR | Status: DC | PRN
  Administered 2024-03-26: 15 mL via INTRAOCULAR

## 2024-03-26 SURGICAL SUPPLY — 10 items
DISSECTOR HYDRO NUCLEUS 50X22 (MISCELLANEOUS) ×1 IMPLANT
DRSG TEGADERM 2-3/8X2-3/4 SM (GAUZE/BANDAGES/DRESSINGS) ×1 IMPLANT
FEE CATARACT SUITE SIGHTPATH (MISCELLANEOUS) ×1 IMPLANT
GLOVE BIOGEL PI IND STRL 8 (GLOVE) ×1 IMPLANT
GLOVE SURG LX STRL 7.5 STRW (GLOVE) ×1 IMPLANT
GLOVE SURG SYN 6.5 PF PI BL (GLOVE) ×1 IMPLANT
LENS IOL TECNIS MONO 20.5 (Intraocular Lens) IMPLANT
NDL FILTER BLUNT 18X1 1/2 (NEEDLE) ×1 IMPLANT
NEEDLE FILTER BLUNT 18X1 1/2 (NEEDLE) ×1 IMPLANT
SYR 3ML LL SCALE MARK (SYRINGE) ×1 IMPLANT

## 2024-03-26 NOTE — Transfer of Care (Signed)
 Immediate Anesthesia Transfer of Care Note  Patient: Courtney Woodard  Procedure(s) Performed: PHACOEMULSIFICATION, CATARACT, WITH IOL INSERTION 10.21 01:00.5 (Left: Eye)  Patient Location: PACU  Anesthesia Type: MAC  Level of Consciousness: awake, alert  and patient cooperative  Airway and Oxygen Therapy: Patient Spontanous Breathing   Post-op Assessment: Post-op Vital signs reviewed, Patient's Cardiovascular Status Stable, Respiratory Function Stable, Patent Airway and No signs of Nausea or vomiting  Post-op Vital Signs: Reviewed and stable  Complications: No notable events documented.

## 2024-03-26 NOTE — Anesthesia Postprocedure Evaluation (Signed)
 Anesthesia Post Note  Patient: DEENA SHAUB  Procedure(s) Performed: PHACOEMULSIFICATION, CATARACT, WITH IOL INSERTION 10.21 01:00.5 (Left: Eye)  Patient location during evaluation: PACU Anesthesia Type: MAC Level of consciousness: awake and alert Pain management: pain level controlled Vital Signs Assessment: post-procedure vital signs reviewed and stable Respiratory status: spontaneous breathing, nonlabored ventilation, respiratory function stable and patient connected to nasal cannula oxygen Cardiovascular status: stable and blood pressure returned to baseline Postop Assessment: no apparent nausea or vomiting Anesthetic complications: no   No notable events documented.   Last Vitals:  Vitals:   03/26/24 1254 03/26/24 1259  BP: 123/75   Pulse: 79 88  Resp: 14 13  Temp: (!) 36.1 C (!) 36.1 C  SpO2: 93% 94%    Last Pain:  Vitals:   03/26/24 1259  TempSrc:   PainSc: 0-No pain                 Ranyia Witting C Miyanna Wiersma

## 2024-03-26 NOTE — Op Note (Signed)
 OPERATIVE NOTE  Courtney Woodard [id number] 03/26/2024   PREOPERATIVE DIAGNOSIS: Nuclear sclerotic cataract left eye. H25.12   POSTOPERATIVE DIAGNOSIS: Nuclear sclerotic cataract left eye. H25.12   PROCEDURE:  Phacoemulsification with posterior chamber intraocular lens placement of the left eye  Ultrasound time: Procedure(s): PHACOEMULSIFICATION, CATARACT, WITH IOL INSERTION 10.21 01:00.5 (Left)  LENS:   Implant Name Type Inv. Item Serial No. Manufacturer Lot No. LRB No. Used Action  LENS IOL TECNIS MONO 20.5 - [id number] Intraocular Lens LENS IOL TECNIS MONO 20.5 [id number] [employer]  Left 1 Implanted      SURGEON:  Courtney HERO. Enola, MD   ANESTHESIA:  Topical with tetracaine  drops, augmented with 1% preservative-free intracameral lidocaine .   COMPLICATIONS:  None.   DESCRIPTION OF PROCEDURE:  The patient was identified in the holding room and transported to the operating room and placed in the supine position under the operating microscope.  The left eye was identified as the operative eye, which was prepped and draped in the usual sterile ophthalmic fashion.   A 1 millimeter clear-corneal paracentesis was made inferotemporally. Preservative-free 1% lidocaine  mixed with 1:1,000 bisulfite-free aqueous solution of epinephrine was injected into the anterior chamber. The anterior chamber was then filled with Viscoat viscoelastic. A 2.4 millimeter keratome was used to make a clear-corneal incision superotemporally. A curvilinear capsulorrhexis was made with a cystotome and capsulorrhexis forceps. Balanced salt  solution was used to hydrodissect and hydrodelineate the nucleus. Phacoemulsification was then used to remove the lens nucleus and epinucleus. The remaining cortex was then removed using the irrigation and aspiration handpiece. Provisc was then placed into the capsular bag to distend it for lens placement. A +20.50 D DCB00 intraocular lens was then injected into the capsular bag. The  remaining viscoelastic was aspirated.   Wounds were hydrated with balanced salt  solution.  The anterior chamber was inflated to a physiologic pressure with balanced salt  solution.  No wound leaks were noted. Moxifloxacin  was injected intracamerally.  Timolol  and Brimonidine  drops were applied to the eye.  The patient was taken to the recovery room in stable condition without complications of anesthesia or surgery.  Courtney Hugger [place] 03/26/2024, 12:53 PM

## 2024-03-26 NOTE — H&P (Signed)
 [facility]   Primary Care Physician:  Watt Bernarda NOVAK, PA-C Ophthalmologist: Dr. Feliciano Ober  Pre-Procedure History & Physical: HPI:  Courtney Woodard is a 68 y.o. female here for cataract surgery.   Past Medical History:  Diagnosis Date   Anxiety    BRCA negative 10/2018   MyRisk neg   Family history of breast cancer    sister is BRCA neg   Family history of ovarian cancer    mother, pt is s/p BSO   Generalized anxiety disorder    GERD (gastroesophageal reflux disease)    Hives    Hyperlipidemia    Hypothyroidism    IBS (irritable bowel syndrome)    Increased risk of breast cancer 10/2018   IBIS=18.7%/riskscore=23.9%   Thyroid disease    Vitamin D deficiency     Past Surgical History:  Procedure Laterality Date   ABDOMINAL HYSTERECTOMY  2016   TAH BSO Dr. Izell   BREAST SURGERY Right    breast biospy needle, negative Dr. Dessa   COLONOSCOPY  09/2016   2 polyps; repeat in 5 yrs; Dr. Jeri   TONSILLECTOMY      Prior to Admission medications   Medication Sig Start Date End Date Taking? Authorizing Provider  escitalopram (LEXAPRO) 20 MG tablet Take 20 mg by mouth every Monday, Wednesday, and Friday.   Yes [provider]  buPROPion (WELLBUTRIN XL) 300 MG 24 hr tablet Take 300 mg by mouth. 12/10/23 06/07/24  [provider]  cetirizine (ZYRTEC) 10 MG tablet Take by mouth. 01/08/18 01/09/24  [provider]  levothyroxine (SYNTHROID) 150 MCG tablet Take 150 mcg by mouth. 12/10/23 06/07/24  [provider]  omeprazole (PRILOSEC) 20 MG capsule 20 mg as needed. 09/30/18   [provider]  rosuvastatin (CRESTOR) 20 MG tablet Take 20 mg by mouth daily. Patient taking differently: Take 20 mg by mouth every Monday, Wednesday, and Friday. 09/20/21   [provider]  Vitamin D, Ergocalciferol, (DRISDOL) 50000 units CAPS capsule  06/18/16   [provider]    Allergies as of 01/29/2024 - Review Complete 01/09/2024   Allergen Reaction Noted   Codeine Itching and Hives 08/17/2016    Family History  Problem Relation Age of Onset   Ovarian cancer Mother 29   Breast cancer Sister 100       BRCA neg 2011   Uterine cancer Sister 67   Uterine cancer Sister        not sure of age   Breast cancer Daughter 29       gene neg    Social History   Socioeconomic History   Marital status: Divorced    Spouse name: Not on file   Number of children: Not on file   Years of education: Not on file   Highest education level: Not on file  Occupational History   Not on file  Tobacco Use   Smoking status: Never   Smokeless tobacco: Never  Vaping Use   Vaping status: Never Used  Substance and Sexual Activity   Alcohol use: No   Drug use: Never   Sexual activity: Not Currently    Birth control/protection: Surgical    Comment: Hysterectomy  Other Topics Concern   Not on file  Social History Narrative   Not on file   Social Drivers of Health   Financial Resource Strain: Not on file  Food Insecurity: No Food Insecurity (12/02/2023)   Received from [facility]   Hunger Vital  Sign    Within the past 12 months, you worried that your food would run out before you got the money to buy more.: Never true    Within the past 12 months, the food you bought just didn't last and you didn't have money to get more.: Never true  Transportation Needs: No Transportation Needs (12/02/2023)   Received from [facility] - Transportation    Lack of Transportation (Medical): No    Lack of Transportation (Non-Medical): No  Physical Activity: Not on file  Stress: Not on file  Social Connections: Not on file  Intimate Partner Violence: Not on file    Review of Systems: See HPI, otherwise negative ROS  Physical Exam: Ht 5' 8 (1.727 m)   Wt 83.9 kg   BMI 28.13 kg/m  General:   Alert, cooperative in NAD Head:  Normocephalic and atraumatic. Respiratory:  Normal work of breathing. Cardiovascular:   RRR  Impression/Plan: Courtney Woodard is here for cataract surgery.  Risks, benefits, limitations, and alternatives regarding cataract surgery have been reviewed with the patient.  Questions have been answered.  All parties agreeable.   Feliciano Bryan Ober, MD  03/26/2024, 7:25 AM

## 2024-04-06 NOTE — Discharge Instructions (Signed)

## 2024-04-06 NOTE — Anesthesia Preprocedure Evaluation (Signed)
 Anesthesia Evaluation  Patient identified by MRN, date of birth, ID band Patient awake    Reviewed: Allergy & Precautions, H&P , NPO status , Patient's Chart, lab work & pertinent test results  Airway Mallampati: II  TM Distance: >3 FB Neck ROM: Full    Dental no notable dental hx.    Pulmonary neg pulmonary ROS   Pulmonary exam normal breath sounds clear to auscultation       Cardiovascular negative cardio ROS Normal cardiovascular exam Rhythm:Regular Rate:Normal     Neuro/Psych  PSYCHIATRIC DISORDERS Anxiety     negative neurological ROS  negative psych ROS   GI/Hepatic negative GI ROS, Neg liver ROS,GERD  ,,  Endo/Other  negative endocrine ROSHypothyroidism    Renal/GU negative Renal ROS  negative genitourinary   Musculoskeletal negative musculoskeletal ROS (+)    Abdominal   Peds negative pediatric ROS (+)  Hematology negative hematology ROS (+)   Anesthesia Other Findings Previous cataract surgery 03-26-24 Dr. Ola  GERD (gastroesophageal reflux disease) Vitamin D deficiency Hyperlipidemia             Thyroid disease Anxiety                         Hypothyroidism Generalized anxiety disorder IBS (irritable bowel syndrome) Hives           Reproductive/Obstetrics negative OB ROS                              Anesthesia Physical Anesthesia Plan  ASA: 2  Anesthesia Plan: MAC   Post-op Pain Management:    Induction: Intravenous  PONV Risk Score and Plan:   Airway Management Planned: Natural Airway and Nasal Cannula  Additional Equipment:   Intra-op Plan:   Post-operative Plan:   Informed Consent: I have reviewed the patients History and Physical, chart, labs and discussed the procedure including the risks, benefits and alternatives for the proposed anesthesia with the patient or authorized representative who has indicated his/her understanding and acceptance.      Dental Advisory Given  Plan Discussed with: Anesthesiologist, CRNA and Surgeon  Anesthesia Plan Comments: (Patient consented for risks of anesthesia including but not limited to:  - adverse reactions to medications - damage to eyes, teeth, lips or other oral mucosa - nerve damage due to positioning  - sore throat or hoarseness - Damage to heart, brain, nerves, lungs, other parts of body or loss of life  Patient voiced understanding and assent.)         Anesthesia Quick Evaluation

## 2024-04-09 ENCOUNTER — Ambulatory Visit: Payer: Self-pay | Admitting: Anesthesiology

## 2024-04-09 ENCOUNTER — Encounter: Payer: Self-pay | Admitting: Ophthalmology

## 2024-04-09 ENCOUNTER — Ambulatory Visit
Admission: RE | Admit: 2024-04-09 | Discharge: 2024-04-09 | Disposition: A | Attending: Ophthalmology | Admitting: Ophthalmology

## 2024-04-09 ENCOUNTER — Other Ambulatory Visit: Payer: Self-pay

## 2024-04-09 ENCOUNTER — Encounter
Admission: RE | Disposition: A | Discharge status: Home / Self Care | Payer: Self-pay | Source: Home / Self Care | Attending: Ophthalmology

## 2024-04-09 HISTORY — PX: CATARACT EXTRACTION W/PHACO: SHX586

## 2024-04-09 SURGERY — PHACOEMULSIFICATION, CATARACT, WITH IOL INSERTION
Anesthesia: Topical | Site: Eye | Laterality: Right

## 2024-04-09 MED ORDER — MIDAZOLAM HCL (PF) 2 MG/2ML IJ SOLN
INTRAMUSCULAR | Status: DC | PRN
  Administered 2024-04-09: 2 mg via INTRAVENOUS

## 2024-04-09 MED ORDER — FENTANYL CITRATE (PF) 100 MCG/2ML IJ SOLN
INTRAMUSCULAR | Status: AC
  Filled 2024-04-09: qty 2

## 2024-04-09 MED ORDER — SIGHTPATH DOSE#1 NA HYALUR & NA CHOND-NA HYALUR IO KIT
PACK | INTRAOCULAR | Status: DC | PRN
  Administered 2024-04-09: 1 via OPHTHALMIC

## 2024-04-09 MED ORDER — BRIMONIDINE TARTRATE-TIMOLOL 0.2-0.5 % OP SOLN
OPHTHALMIC | Status: DC | PRN
  Administered 2024-04-09: 1 [drp] via OPHTHALMIC

## 2024-04-09 MED ORDER — FENTANYL CITRATE (PF) 100 MCG/2ML IJ SOLN
INTRAMUSCULAR | Status: DC | PRN
  Administered 2024-04-09: 50 ug via INTRAVENOUS

## 2024-04-09 MED ORDER — SIGHTPATH DOSE#1 BSS IO SOLN
INTRAOCULAR | Status: DC | PRN
  Administered 2024-04-09: 75 mL via OPHTHALMIC

## 2024-04-09 MED ORDER — PHENYLEPHRINE HCL 10 % OP SOLN
1.0000 [drp] | OPHTHALMIC | Status: DC | PRN
  Administered 2024-04-09 (×3): 1 [drp] via OPHTHALMIC

## 2024-04-09 MED ORDER — PHENYLEPHRINE HCL 10 % OP SOLN
OPHTHALMIC | Status: AC
  Filled 2024-04-09: qty 5

## 2024-04-09 MED ORDER — SIGHTPATH DOSE#1 BSS IO SOLN
INTRAOCULAR | Status: DC | PRN
  Administered 2024-04-09: 15 mL via INTRAOCULAR

## 2024-04-09 MED ORDER — TETRACAINE HCL 0.5 % OP SOLN
OPHTHALMIC | Status: AC
  Filled 2024-04-09: qty 4

## 2024-04-09 MED ORDER — CYCLOPENTOLATE HCL 2 % OP SOLN
1.0000 [drp] | OPHTHALMIC | Status: DC | PRN
  Administered 2024-04-09 (×3): 1 [drp] via OPHTHALMIC

## 2024-04-09 MED ORDER — TETRACAINE HCL 0.5 % OP SOLN
1.0000 [drp] | OPHTHALMIC | Status: DC | PRN
  Administered 2024-04-09 (×3): 1 [drp] via OPHTHALMIC

## 2024-04-09 MED ORDER — LACTATED RINGERS IV SOLN: INTRAVENOUS | Status: DC

## 2024-04-09 MED ORDER — LIDOCAINE HCL (PF) 2 % IJ SOLN
INTRAOCULAR | Status: DC | PRN
  Administered 2024-04-09: 4 mL via INTRAOCULAR

## 2024-04-09 MED ORDER — MIDAZOLAM HCL 2 MG/2ML IJ SOLN
INTRAMUSCULAR | Status: AC
  Filled 2024-04-09: qty 2

## 2024-04-09 MED ORDER — MOXIFLOXACIN HCL 0.5 % OP SOLN
OPHTHALMIC | Status: DC | PRN
  Administered 2024-04-09: .2 mL via OPHTHALMIC

## 2024-04-09 MED ORDER — CYCLOPENTOLATE HCL 2 % OP SOLN
OPHTHALMIC | Status: AC
  Filled 2024-04-09: qty 2

## 2024-04-09 SURGICAL SUPPLY — 9 items
DISSECTOR HYDRO NUCLEUS 50X22 (MISCELLANEOUS) ×1 IMPLANT
DRSG TEGADERM 2-3/8X2-3/4 SM (GAUZE/BANDAGES/DRESSINGS) ×1 IMPLANT
FEE CATARACT SUITE SIGHTPATH (MISCELLANEOUS) ×1 IMPLANT
GLOVE BIOGEL PI IND STRL 8 (GLOVE) ×1 IMPLANT
GLOVE SURG LX STRL 7.5 STRW (GLOVE) ×1 IMPLANT
GLOVE SURG SYN 6.5 PF PI BL (GLOVE) ×1 IMPLANT
LENS IOL TECNIS MONO 20.0 (Intraocular Lens) IMPLANT
NDL FILTER BLUNT 18X1 1/2 (NEEDLE) ×1 IMPLANT
SYR 3ML LL SCALE MARK (SYRINGE) ×1 IMPLANT

## 2024-04-09 NOTE — Transfer of Care (Signed)
 Immediate Anesthesia Transfer of Care Note  Patient: Courtney Woodard  Procedure(s) Performed: PHACOEMULSIFICATION, CATARACT, WITH IOL INSERTION 7.07 00:47.2 (Right: Eye)  Patient Location: PACU  Anesthesia Type: MAC  Level of Consciousness: awake, alert  and patient cooperative  Airway and Oxygen Therapy: Patient Spontanous Breathing   Post-op Assessment: Post-op Vital signs reviewed, Patient's Cardiovascular Status Stable, Respiratory Function Stable, Patent Airway and No signs of Nausea or vomiting  Post-op Vital Signs: Reviewed and stable  Complications: No notable events documented.

## 2024-04-09 NOTE — Op Note (Signed)
 OPERATIVE NOTE  Courtney Woodard [id number] 04/09/2024   PREOPERATIVE DIAGNOSIS: Nuclear sclerotic cataract right eye. H25.11   POSTOPERATIVE DIAGNOSIS: Nuclear sclerotic cataract right eye. H25.11   PROCEDURE:  Phacoemulsification with posterior chamber intraocular lens placement of the right eye  Ultrasound time: Procedure(s): PHACOEMULSIFICATION, CATARACT, WITH IOL INSERTION 7.07 00:47.2 (Right)  LENS:   Implant Name Type Inv. Item Serial No. Manufacturer Lot No. LRB No. Used Action  LENS IOL TECNIS MONO 20.0 - [id number] Intraocular Lens LENS IOL TECNIS MONO 20.0 [id number] [employer]  Right 1 Implanted      SURGEON:  Feliciano HERO. Enola, MD   ANESTHESIA:  Topical with tetracaine  drops, augmented with 1% preservative-free intracameral lidocaine .   COMPLICATIONS:  None.   DESCRIPTION OF PROCEDURE:  The patient was identified in the holding room and transported to the operating room and placed in the supine position under the operating microscope.  The right eye was identified as the operative eye, which was prepped and draped in the usual sterile ophthalmic fashion.   A 1 millimeter clear-corneal paracentesis was made superotemporally. Preservative-free 1% lidocaine  mixed with 1:1,000 bisulfite-free aqueous solution of epinephrine was injected into the anterior chamber. The anterior chamber was then filled with Viscoat viscoelastic. A 2.4 millimeter keratome was used to make a clear-corneal incision inferotemporally. A curvilinear capsulorrhexis was made with a cystotome and capsulorrhexis forceps. Balanced salt  solution was used to hydrodissect and hydrodelineate the nucleus. Phacoemulsification was then used to remove the lens nucleus and epinucleus. The remaining cortex was then removed using the irrigation and aspiration handpiece. Provisc was then placed into the capsular bag to distend it for lens placement. A +20.00 D DCB00 intraocular lens was then injected into the capsular bag. The  remaining viscoelastic was aspirated.   Wounds were hydrated with balanced salt  solution.  The anterior chamber was inflated to a physiologic pressure with balanced salt  solution.  No wound leaks were noted. Moxifloxacin  was injected intracamerally.  Timolol  and Brimonidine  drops were applied to the eye.  The patient was taken to the recovery room in stable condition without complications of anesthesia or surgery.  [employer] 04/09/2024, 7:58 AM

## 2024-04-09 NOTE — Anesthesia Postprocedure Evaluation (Signed)
 Anesthesia Post Note  Patient: Courtney Woodard  Procedure(s) Performed: PHACOEMULSIFICATION, CATARACT, WITH IOL INSERTION 7.07 00:47.2 (Right: Eye)  Patient location during evaluation: PACU Anesthesia Type: MAC Level of consciousness: awake and alert Pain management: pain level controlled Vital Signs Assessment: post-procedure vital signs reviewed and stable Respiratory status: spontaneous breathing, nonlabored ventilation, respiratory function stable and patient connected to nasal cannula oxygen Cardiovascular status: stable and blood pressure returned to baseline Postop Assessment: no apparent nausea or vomiting Anesthetic complications: no   No notable events documented.   Last Vitals:  Vitals:   04/09/24 0800 04/09/24 0804  BP: 108/71 110/72  Pulse: 98 80  Resp: 14 12  Temp: (!) 36.3 C   SpO2: 97% 98%    Last Pain:  Vitals:   04/09/24 0804  TempSrc:   PainSc: 0-No pain                 Brownie Nehme C Conny Moening

## 2024-04-09 NOTE — H&P (Signed)
 [facility]   Primary Care Physician:  [facility] Primary Ophthalmologist: Dr. Feliciano Ober  Pre-Procedure History & Physical: HPI:  Courtney Woodard is a 68 y.o. female here for cataract surgery.   Past Medical History:  Diagnosis Date   Anxiety    BRCA negative 10/2018   MyRisk neg   Family history of breast cancer    sister is BRCA neg   Family history of ovarian cancer    mother, pt is s/p BSO   Generalized anxiety disorder    GERD (gastroesophageal reflux disease)    Hives    Hyperlipidemia    Hypothyroidism    IBS (irritable bowel syndrome)    Increased risk of breast cancer 10/2018   IBIS=18.7%/riskscore=23.9%   Thyroid disease    Vitamin D deficiency     Past Surgical History:  Procedure Laterality Date   ABDOMINAL HYSTERECTOMY  2016   TAH BSO Dr. Izell   BREAST SURGERY Right    breast biospy needle, negative Dr. Dessa   CATARACT EXTRACTION W/PHACO Left 03/26/2024   Procedure: PHACOEMULSIFICATION, CATARACT, WITH IOL INSERTION 10.21 01:00.5;  Surgeon: Ober Feliciano Hugger, MD;  Location: [facility];  Service: Ophthalmology;  Laterality: Left;   COLONOSCOPY  09/2016   2 polyps; repeat in 5 yrs; Dr. Jeri   TONSILLECTOMY      Prior to Admission medications   Medication Sig Start Date End Date Taking? Authorizing Provider  buPROPion (WELLBUTRIN XL) 300 MG 24 hr tablet Take 300 mg by mouth. 12/10/23 06/07/24 Yes [provider]  escitalopram (LEXAPRO) 20 MG tablet Take 20 mg by mouth every Monday, Wednesday, and Friday.   Yes [provider]  levothyroxine (SYNTHROID) 150 MCG tablet Take 150 mcg by mouth. 12/10/23 06/07/24 Yes [provider]  omeprazole (PRILOSEC) 20 MG capsule 20 mg as needed. 09/30/18  Yes [provider]  rosuvastatin (CRESTOR) 20 MG tablet Take 20 mg by mouth daily. Patient taking differently: Take 20 mg by mouth every Monday, Wednesday, and Friday. 09/20/21  Yes [provider]  Vitamin D,  Ergocalciferol, (DRISDOL) 50000 units CAPS capsule  06/18/16  Yes [provider]  cetirizine (ZYRTEC) 10 MG tablet Take by mouth. 01/08/18 01/09/24  [provider]    Allergies as of 01/29/2024 - Review Complete 01/09/2024  Allergen Reaction Noted   Codeine Itching and Hives 08/17/2016    Family History  Problem Relation Age of Onset   Ovarian cancer Mother 31   Breast cancer Sister 59       BRCA neg 2011   Uterine cancer Sister 28   Uterine cancer Sister        not sure of age   Breast cancer Daughter 65       gene neg    Social History   Socioeconomic History   Marital status: Divorced    Spouse name: Not on file   Number of children: Not on file   Years of education: Not on file   Highest education level: Not on file  Occupational History   Not on file  Tobacco Use   Smoking status: Never   Smokeless tobacco: Never  Vaping Use   Vaping status: Never Used  Substance and Sexual Activity   Alcohol use: No   Drug use: Never   Sexual activity: Not Currently    Birth control/protection: Surgical    Comment: Hysterectomy  Other Topics Concern   Not on file  Social History Narrative   Not on  file   Social Drivers of Health   Financial Resource Strain: Not on file  Food Insecurity: No Food Insecurity (12/02/2023)   Received from [facility]   Hunger Vital Sign    Within the past 12 months, you worried that your food would run out before you got the money to buy more.: Never true    Within the past 12 months, the food you bought just didn't last and you didn't have money to get more.: Never true  Transportation Needs: No Transportation Needs (12/02/2023)   Received [facility] - [facility]    Lack of Transportation (Medical): No    Lack of Transportation (Non-Medical): No  Physical Activity: Not on file  Stress: Not on file  Social Connections: Not on file  Intimate Partner Violence: Not on file    Review of  Systems: See HPI, otherwise negative ROS  Physical Exam: BP 119/84   Pulse 88   Temp 97.9 F (36.6 C) (Temporal)   Resp 13   Wt 83 kg   SpO2 96%   BMI 27.83 kg/m  General:   Alert, cooperative in NAD Head:  Normocephalic and atraumatic. Respiratory:  Normal work of breathing. Cardiovascular:  RRR  Impression/Plan: Courtney Woodard is here for cataract surgery.  Risks, benefits, limitations, and alternatives regarding cataract surgery have been reviewed with the patient.  Questions have been answered.  All parties agreeable.   Feliciano Bryan Ober, MD  04/09/2024, 7:12 AM
